# Patient Record
Sex: Male | Born: 1960 | Race: White | Hispanic: No | Marital: Single | Smoking: Current every day smoker
Health system: Southern US, Community
[De-identification: ages and names within clinical notes are randomized; demographics above are authoritative.]

## PROBLEM LIST (undated history)

## (undated) DIAGNOSIS — Z720 Tobacco use: Secondary | ICD-10-CM

## (undated) DIAGNOSIS — Z789 Other specified health status: Secondary | ICD-10-CM

## (undated) HISTORY — PX: APPENDECTOMY: SHX54

---

## 2005-10-27 ENCOUNTER — Emergency Department (HOSPITAL_COMMUNITY): Admission: EM | Admit: 2005-10-27 | Discharge: 2005-10-27 | Payer: Self-pay | Admitting: Emergency Medicine

## 2006-06-25 ENCOUNTER — Emergency Department (HOSPITAL_COMMUNITY): Admission: EM | Admit: 2006-06-25 | Discharge: 2006-06-26 | Payer: Self-pay | Admitting: Emergency Medicine

## 2006-07-08 ENCOUNTER — Ambulatory Visit: Payer: Self-pay | Admitting: *Deleted

## 2006-07-10 ENCOUNTER — Ambulatory Visit: Payer: Self-pay

## 2006-07-10 ENCOUNTER — Encounter: Payer: Self-pay | Admitting: Cardiology

## 2006-07-30 ENCOUNTER — Ambulatory Visit: Payer: Self-pay | Admitting: *Deleted

## 2006-08-14 ENCOUNTER — Ambulatory Visit (HOSPITAL_COMMUNITY): Admission: RE | Admit: 2006-08-14 | Discharge: 2006-08-14 | Payer: Self-pay | Admitting: Cardiovascular Disease

## 2007-09-04 IMAGING — CR DG CHEST 1V PORT
1 series · 1 of 1 positions shown · non-contrast
Comparison: None.

CLINICAL DATA: Heart palpitations.  
 PORTABLE CHEST - 1 VIEW:

[view not recorded]
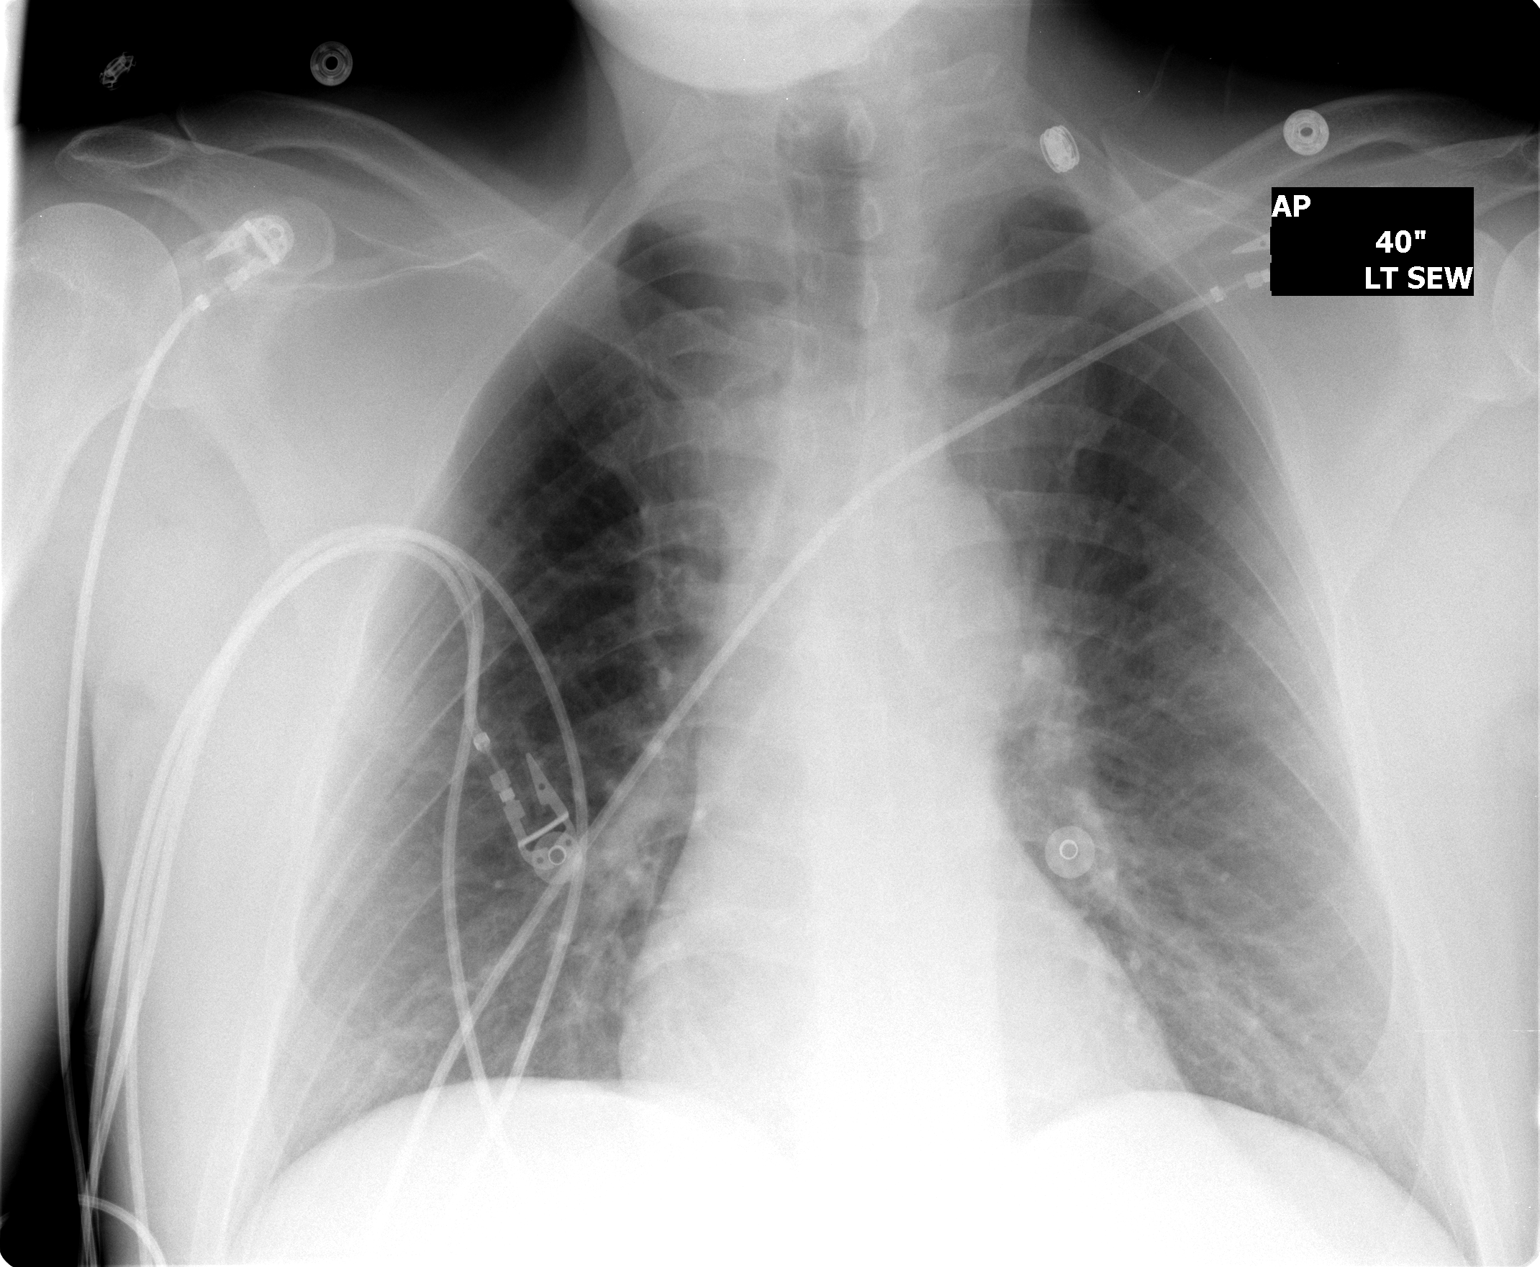

[1 of 1 positions shown; findings below may reference images not displayed]

FINDINGS: Midline trachea.  Heart size normal.   Mediastinal contours are unremarkable.  Costophrenic angles are sharp.  Peribronchial thickening.  No focal opacity.  No evidence of congestive failure.
IMPRESSION: Peribronchial thickening could be related to smoking or chronic bronchitis.  No acute disease.

## 2011-05-17 NOTE — Assessment & Plan Note (Signed)
[facility]                              CARDIOLOGY OFFICE NOTE   VINCENZO, STAVE                     MRN:          [id number]  DATE:07/30/2006                            DOB:          [birth year]    The patient is a pleasant 50 year old white married male with chest pain and  palpitations, who unfortunately continues to smoke and use excess EtOH.  He  also has a history of hyperlipidemia.   The patient states since we last saw him, he has decreased his EtOH in half,  and is down to 6 beers a day.  However, he has continued smoking.  He went  on a week vacation to the beach and felt well, but on returning to work he  noted some precordial tightness on a couple of occasions.  I should note  that his 2D echo revealed normal EF with 65%, no recent wall motion  abnormalities. This was felt to be a normal echo.  His stress test revealed  no ischemia.  He exercised for 10 minutes, had no chest pain.   He has not started his statin and aspirin, as previously suggested.   PHYSICAL EXAMINATION:  VITAL SIGNS:  Blood pressure today is 114/80, pulse  is 76.   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  However, with his  recurrent chest pain and the fact that he has multiple risk factors, I have  suggested a CT angiogram before giving him approval.  I have again discuss  the importance of discontinuing cigarettes and alcohol, and of taking his  aspirin and statin.  He will following up with Dr. Clarene Duke concerning this.  We will schedule the CT angiogram for the next week.                              Cecil Cranker, MD, [facility]    EJL/MedQ  DD:  07/30/2006  DT:  07/31/2006  Job #:  [id number]   cc:   Aida Puffer

## 2011-05-17 NOTE — Letter (Signed)
July 30, 2006     Aida Puffer, M.D.  [address]  [place], [place] [address]   RE:  [name], [name]  MRN:  [id number]  /  DOB:  1961/01/21   Dear Rosanne Ashing:   It was a pleasure to see your nice patient, Kelly Gates, for followup.  As noted, the 2-D echo and stress test were normal.  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   Thank you for the opportunity to share in this nice gentleman's care.    Sincerely,      E. Graceann Congress, MD, [facility]   EJL/MedQ  DD:  07/30/2006  DT:  07/31/2006  Job #:  [id number]

## 2011-05-17 NOTE — Assessment & Plan Note (Signed)
[facility]                              CARDIOLOGY OFFICE NOTE   Kelly Gates, Kelly Gates                     MRN:          [id number]  DATE:08/18/2006                            DOB:          [birth year]    This is a 50 year old white married male with chest pain and palpitations,  unfortunately continues to smoke and use excess ethanol.  He has history of  hyperlipidemia.  He has a recurrent pain.  He underwent a CT angiogram which  revealed normal study revealing no evidence of obstructive coronary disease,  no anomalous anatomy identified and normal LV function.  He also had a  history of tachy palpitations which improved with some reduction in ethanol.  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  Will refer  him back to Dr. Aida Puffer for follow-up.  Also apparently has a history  of hyperlipidemia.  He apparently has been given aspirin or Statin but has  not been taking it recently.                              Cecil Cranker, MD, [facility]    EJL/MedQ  DD:  08/18/2006  DT:  08/18/2006  Job #:  [id number]   cc:   Aida Puffer

## 2011-05-17 NOTE — Assessment & Plan Note (Signed)
[facility]                              CARDIOLOGY OFFICE NOTE   HENDRICKS, SCHWANDT                     MRN:          [id number]  DATE:07/30/2006                            DOB:          06/26/1961    This is a letter to Dr. Lesly Rubenstein concerning   CANCELED DICTATION                              E. Graceann Congress, MD, [facility]    EJL/MedQ  DD:  07/30/2006  DT:  07/31/2006  Job #:  [id number]

## 2011-05-17 NOTE — Assessment & Plan Note (Signed)
[facility]                              CARDIOLOGY OFFICE NOTE   Kelly, Gates                     MRN:          [id number]  DATE:                                      DOB:          1961-06-02    REFERRING PHYSICIAN:  Alric Quan    It was pleasure to see your nice patient Kelly Gates for cardiac  evaluation on July 08, 2006.  The patient states he has had recurrent  palpitations for several years, but these have increased recently.  He has  been to the emergency room on several occasions, most recently on June 25, 2006.   He appeared to be stable at that time, and he was referred here for further  evaluation.   Apparently, he did have a cardiac evaluation several years ago.   The renal profile at the hospital was normal.  The enzymes were negative.   The patient has had occasional chest pressure when he has the palpitations.  These last 30-60 minutes.   He has had no cerebral symptoms.   The patient is allergic to PENICILLIN.   He has a history of excess ethanol drinking, 6-12 beers a day, and he smokes  a pack of cigarettes a day.  He uses no other drugs and is on no medical  therapy.  He has been informed of hyperlipidemia, and I believe you had  prescribed medication for him, but he is not taking it.   He is on no medications at this time.   PAST MEDICAL HISTORY:  Reveals no serious illness.  No hospitalizations or  surgery.   REVIEW OF SYSTEMS:  HEENT:  Unremarkable.  CARDIORESPIRATORY:  As above.  GI:  Negative.  GU:  Negative.  The remainder of the review of systems is  unremarkable.   SOCIAL HISTORY:  He is a Naval architect.  He enjoys fishing.  He is married  and has one child.   FAMILY HISTORY:  Reveals father is 10, healthy.  Mother is 74, with history  of heart disease, CVAs, __________.  One brother, 37, is healthy.  Otherwise, family history unremarkable.   PHYSICAL EXAMINATION:  VITAL SIGNS:   Blood pressure 130/72, pulse 75, normal  sinus rhythm.  NECK: Normal.JVP is not elevated.  Carotid pulses are palpable.  He has no  bruits.  Thyroid normal.  LUNGS:  Revealed decreased breath sounds, but no rales or rhonchi.  CARDIAC:  Normal.  ABDOMEN:  Normal.  EXTREMITIES:  Normal pulses palpable bilaterally.  NEUROLOGIC:  Unremarkable.   EKG is normal.   IMPRESSION:  1.  Tachy-palpitations, probably supraventricular.  2.  Excess ethanol.  3.  Cigarette abuse.  4.  History of hyperlipidemia.   I have strongly recommended that he discontinue cigarettes and ethanol.  He  states that he can do this on his own, but I am not certain.   I also suggested that he start the statin which you suggested for him.   In addition, we plan to obtain a stress Myoview and a 2D echocardiogram.  Following this, if normal, he should be able to return to his employment as  a Naval architect.   If the palpitations persist, then we will need to obtain an event monitor.  In addition, he is to have a TSH.   Thank you for the opportunity to share in this nice patient's care.  I will  plan to see him back in 3-4 weeks or p.r.n.                              E. Graceann Congress, MD, [facility]    EJL/MedQ  DD:  07/08/2006  DT:  07/08/2006  Job #:  [id number]   CC:  Dr. Bunnie Philips

## 2014-05-24 ENCOUNTER — Encounter (HOSPITAL_COMMUNITY)
Admission: EM | Disposition: A | Discharge status: Home / Self Care | Payer: Self-pay | Source: Home / Self Care | Attending: Internal Medicine

## 2014-05-24 ENCOUNTER — Emergency Department (HOSPITAL_COMMUNITY): Payer: Self-pay

## 2014-05-24 ENCOUNTER — Encounter (HOSPITAL_COMMUNITY): Payer: Self-pay | Admitting: Emergency Medicine

## 2014-05-24 ENCOUNTER — Observation Stay (HOSPITAL_COMMUNITY)
Admission: EM | Admit: 2014-05-24 | Discharge: 2014-05-24 | Disposition: A | Discharge status: Home / Self Care | Payer: Self-pay | Attending: Internal Medicine | Admitting: Internal Medicine

## 2014-05-24 DIAGNOSIS — IMO0002 Reserved for concepts with insufficient information to code with codable children: Secondary | ICD-10-CM | POA: Insufficient documentation

## 2014-05-24 DIAGNOSIS — I10 Essential (primary) hypertension: Secondary | ICD-10-CM | POA: Diagnosis present

## 2014-05-24 DIAGNOSIS — Z880 Allergy status to penicillin: Secondary | ICD-10-CM | POA: Insufficient documentation

## 2014-05-24 DIAGNOSIS — R079 Chest pain, unspecified: Principal | ICD-10-CM | POA: Diagnosis present

## 2014-05-24 DIAGNOSIS — E785 Hyperlipidemia, unspecified: Secondary | ICD-10-CM | POA: Diagnosis present

## 2014-05-24 DIAGNOSIS — Z7982 Long term (current) use of aspirin: Secondary | ICD-10-CM | POA: Insufficient documentation

## 2014-05-24 DIAGNOSIS — R000 Tachycardia, unspecified: Secondary | ICD-10-CM | POA: Insufficient documentation

## 2014-05-24 DIAGNOSIS — F17200 Nicotine dependence, unspecified, uncomplicated: Secondary | ICD-10-CM | POA: Insufficient documentation

## 2014-05-24 DIAGNOSIS — F411 Generalized anxiety disorder: Secondary | ICD-10-CM | POA: Insufficient documentation

## 2014-05-24 DIAGNOSIS — I200 Unstable angina: Secondary | ICD-10-CM | POA: Diagnosis present

## 2014-05-24 DIAGNOSIS — R61 Generalized hyperhidrosis: Secondary | ICD-10-CM | POA: Insufficient documentation

## 2014-05-24 DIAGNOSIS — R030 Elevated blood-pressure reading, without diagnosis of hypertension: Secondary | ICD-10-CM

## 2014-05-24 DIAGNOSIS — Z720 Tobacco use: Secondary | ICD-10-CM | POA: Diagnosis present

## 2014-05-24 DIAGNOSIS — IMO0001 Reserved for inherently not codable concepts without codable children: Secondary | ICD-10-CM | POA: Diagnosis present

## 2014-05-24 HISTORY — DX: Tobacco use: Z72.0

## 2014-05-24 HISTORY — PX: LEFT HEART CATHETERIZATION WITH CORONARY ANGIOGRAM: SHX5451

## 2014-05-24 HISTORY — DX: Other specified health status: Z78.9

## 2014-05-24 LAB — BASIC METABOLIC PANEL
BUN: 9 mg/dL (ref 6–23)
CALCIUM: 9 mg/dL (ref 8.4–10.5)
CHLORIDE: 105 meq/L (ref 96–112)
CO2: 23 meq/L (ref 19–32)
CREATININE: 0.91 mg/dL (ref 0.50–1.35)
Glucose, Bld: 93 mg/dL (ref 70–99)
Potassium: 3.8 mEq/L (ref 3.7–5.3)
SODIUM: 141 meq/L (ref 137–147)

## 2014-05-24 LAB — LIPID PANEL
Cholesterol: 213 mg/dL — ABNORMAL HIGH (ref 0–200)
HDL: 48 mg/dL (ref >39)
LDL Cholesterol: 142 mg/dL — ABNORMAL HIGH (ref 0–99)
TRIGLYCERIDES: 117 mg/dL (ref <150)
Total CHOL/HDL Ratio: 4.4 RATIO
VLDL: 23 mg/dL (ref 0–40)

## 2014-05-24 LAB — CBC
HEMATOCRIT: 44.1 % (ref 39.0–52.0)
HEMATOCRIT: 42.5 % (ref 39.0–52.0)
HEMOGLOBIN: 15 g/dL (ref 13.0–17.0)
Hemoglobin: 15.8 g/dL (ref 13.0–17.0)
MCH: 34.2 pg — AB (ref 26.0–34.0)
MCH: 33.9 pg (ref 26.0–34.0)
MCHC: 35.8 g/dL (ref 30.0–36.0)
MCHC: 35.3 g/dL (ref 30.0–36.0)
MCV: 95.5 fL (ref 78.0–100.0)
MCV: 96.2 fL (ref 78.0–100.0)
Platelets: 214 10*3/uL (ref 150–400)
Platelets: 198 10*3/uL (ref 150–400)
RBC: 4.62 MIL/uL (ref 4.22–5.81)
RBC: 4.42 MIL/uL (ref 4.22–5.81)
RDW: 12.9 % (ref 11.5–15.5)
RDW: 12.9 % (ref 11.5–15.5)
WBC: 7.7 10*3/uL (ref 4.0–10.5)
WBC: 7.4 10*3/uL (ref 4.0–10.5)

## 2014-05-24 LAB — COMPREHENSIVE METABOLIC PANEL
ALBUMIN: 3.5 g/dL (ref 3.5–5.2)
ALT: 22 U/L (ref 0–53)
AST: 25 U/L (ref 0–37)
Alkaline Phosphatase: 130 U/L — ABNORMAL HIGH (ref 39–117)
BUN: 9 mg/dL (ref 6–23)
CO2: 24 mEq/L (ref 19–32)
CREATININE: 0.83 mg/dL (ref 0.50–1.35)
Calcium: 9.3 mg/dL (ref 8.4–10.5)
Chloride: 105 mEq/L (ref 96–112)
GFR calc Af Amer: >90 mL/min (ref >90)
GFR calc non Af Amer: >90 mL/min (ref >90)
Glucose, Bld: 85 mg/dL (ref 70–99)
Potassium: 4 mEq/L (ref 3.7–5.3)
Sodium: 142 mEq/L (ref 137–147)
TOTAL PROTEIN: 6.6 g/dL (ref 6.0–8.3)
Total Bilirubin: 0.6 mg/dL (ref 0.3–1.2)

## 2014-05-24 LAB — I-STAT TROPONIN, ED: TROPONIN I, POC: 0 ng/mL (ref 0.00–0.08)

## 2014-05-24 LAB — RAPID URINE DRUG SCREEN, HOSP PERFORMED
Amphetamines: NOT DETECTED
BENZODIAZEPINES: NOT DETECTED
Barbiturates: NOT DETECTED
Cocaine: NOT DETECTED
Opiates: NOT DETECTED
Tetrahydrocannabinol: NOT DETECTED

## 2014-05-24 LAB — LIPASE, BLOOD: LIPASE: 27 U/L (ref 11–59)

## 2014-05-24 LAB — TROPONIN I: Troponin I: <0.3 ng/mL (ref <0.30)

## 2014-05-24 SURGERY — LEFT HEART CATHETERIZATION WITH CORONARY ANGIOGRAM
Anesthesia: LOCAL

## 2014-05-24 MED ORDER — SODIUM CHLORIDE 0.9 % IJ SOLN
3.0000 mL | Freq: Two times a day (BID) | INTRAMUSCULAR | Status: DC
  Administered 2014-05-24: 3 mL via INTRAVENOUS

## 2014-05-24 MED ORDER — MORPHINE SULFATE 2 MG/ML IJ SOLN: 2.0000 mg | INTRAMUSCULAR | Status: DC | PRN

## 2014-05-24 MED ORDER — HYDRALAZINE HCL 20 MG/ML IJ SOLN: 10.0000 mg | INTRAMUSCULAR | Status: DC | PRN

## 2014-05-24 MED ORDER — ASPIRIN 81 MG PO CHEW: 81.0000 mg | CHEWABLE_TABLET | ORAL | Status: DC

## 2014-05-24 MED ORDER — ASPIRIN 81 MG PO CHEW: 81.0000 mg | CHEWABLE_TABLET | Freq: Every day | ORAL | Status: DC

## 2014-05-24 MED ORDER — ASPIRIN 81 MG PO CHEW
324.0000 mg | CHEWABLE_TABLET | Freq: Once | ORAL | Status: AC
  Administered 2014-05-24: 324 mg via ORAL
  Filled 2014-05-24: qty 4

## 2014-05-24 MED ORDER — SODIUM CHLORIDE 0.9 % IV BOLUS (SEPSIS): 1000.0000 mL | Freq: Once | INTRAVENOUS | Status: DC

## 2014-05-24 MED ORDER — PNEUMOCOCCAL VAC POLYVALENT 25 MCG/0.5ML IJ INJ: 0.5000 mL | INJECTION | INTRAMUSCULAR | Status: DC

## 2014-05-24 MED ORDER — IOHEXOL 350 MG/ML SOLN
80.0000 mL | Freq: Once | INTRAVENOUS | Status: AC | PRN
  Administered 2014-05-24: 80 mL via INTRAVENOUS

## 2014-05-24 MED ORDER — NITROGLYCERIN 0.4 MG SL SUBL: 0.4000 mg | SUBLINGUAL_TABLET | SUBLINGUAL | Status: DC | PRN

## 2014-05-24 MED ORDER — SODIUM CHLORIDE 0.9 % IJ SOLN: 3.0000 mL | Freq: Two times a day (BID) | INTRAMUSCULAR | Status: DC

## 2014-05-24 MED ORDER — SODIUM CHLORIDE 0.9 % IV SOLN: INTRAVENOUS | Status: DC

## 2014-05-24 MED ORDER — LORAZEPAM 1 MG PO TABS
0.5000 mg | ORAL_TABLET | Freq: Once | ORAL | Status: AC
  Administered 2014-05-24: 0.5 mg via ORAL
  Filled 2014-05-24: qty 1

## 2014-05-24 MED ORDER — VERAPAMIL HCL 2.5 MG/ML IV SOLN
INTRAVENOUS | Status: AC
  Filled 2014-05-24: qty 2

## 2014-05-24 MED ORDER — NITROGLYCERIN 0.2 MG/ML ON CALL CATH LAB
INTRAVENOUS | Status: AC
  Filled 2014-05-24: qty 1

## 2014-05-24 MED ORDER — ASPIRIN EC 325 MG PO TBEC
325.0000 mg | DELAYED_RELEASE_TABLET | Freq: Every day | ORAL | Status: DC
Start: 2014-05-24 — End: 2014-05-24

## 2014-05-24 MED ORDER — ENOXAPARIN SODIUM 40 MG/0.4ML ~~LOC~~ SOLN
40.0000 mg | SUBCUTANEOUS | Status: DC
  Filled 2014-05-24 (×2): qty 0.4

## 2014-05-24 MED ORDER — SODIUM CHLORIDE 0.9 % IV SOLN
INTRAVENOUS | Status: DC
  Filled 2014-05-24 (×2): qty 1000

## 2014-05-24 MED ORDER — LIDOCAINE HCL (PF) 1 % IJ SOLN
INTRAMUSCULAR | Status: AC
  Filled 2014-05-24: qty 30

## 2014-05-24 MED ORDER — SODIUM CHLORIDE 0.9 % IV SOLN: 250.0000 mL | INTRAVENOUS | Status: DC | PRN

## 2014-05-24 MED ORDER — ASPIRIN 325 MG PO TABS: 325.0000 mg | ORAL_TABLET | ORAL | Status: DC

## 2014-05-24 MED ORDER — PNEUMOCOCCAL VAC POLYVALENT 25 MCG/0.5ML IJ INJ
0.5000 mL | INJECTION | INTRAMUSCULAR | Status: DC
  Filled 2014-05-24: qty 0.5

## 2014-05-24 MED ORDER — SODIUM CHLORIDE 0.9 % IV SOLN
1.0000 mL/kg/h | INTRAVENOUS | Status: AC
  Administered 2014-05-24: 1 mL/kg/h via INTRAVENOUS

## 2014-05-24 MED ORDER — SIMVASTATIN 20 MG PO TABS: 20.0000 mg | ORAL_TABLET | Freq: Every day | ORAL | Status: DC

## 2014-05-24 MED ORDER — LISINOPRIL 10 MG PO TABS: 10.0000 mg | ORAL_TABLET | Freq: Every day | ORAL | Status: DC

## 2014-05-24 MED ORDER — SODIUM CHLORIDE 0.9 % IJ SOLN: 3.0000 mL | INTRAMUSCULAR | Status: DC | PRN

## 2014-05-24 MED ORDER — METOPROLOL TARTRATE 12.5 MG HALF TABLET: 12.5000 mg | ORAL_TABLET | Freq: Two times a day (BID) | ORAL | Status: DC

## 2014-05-24 MED ORDER — METOPROLOL TARTRATE 12.5 MG HALF TABLET
12.5000 mg | ORAL_TABLET | Freq: Two times a day (BID) | ORAL | Status: DC
  Administered 2014-05-24: 12.5 mg via ORAL
  Filled 2014-05-24 (×2): qty 1

## 2014-05-24 MED ORDER — SODIUM CHLORIDE 0.9 % IV BOLUS (SEPSIS)
2000.0000 mL | Freq: Once | INTRAVENOUS | Status: AC
  Administered 2014-05-24: 2000 mL via INTRAVENOUS

## 2014-05-24 MED ORDER — HEPARIN SODIUM (PORCINE) 1000 UNIT/ML IJ SOLN
INTRAMUSCULAR | Status: AC
  Filled 2014-05-24: qty 1

## 2014-05-24 MED ORDER — HEPARIN (PORCINE) IN NACL 2-0.9 UNIT/ML-% IJ SOLN
INTRAMUSCULAR | Status: AC
  Filled 2014-05-24: qty 1500

## 2014-05-24 MED ORDER — MIDAZOLAM HCL 2 MG/2ML IJ SOLN
INTRAMUSCULAR | Status: AC
  Filled 2014-05-24: qty 2

## 2014-05-24 MED ORDER — LISINOPRIL 10 MG PO TABS
10.0000 mg | ORAL_TABLET | Freq: Every day | ORAL | Status: DC
Start: 2014-05-24 — End: 2014-05-24
  Administered 2014-05-24: 10 mg via ORAL
  Filled 2014-05-24: qty 1

## 2014-05-24 MED ORDER — SIMVASTATIN 20 MG PO TABS
20.0000 mg | ORAL_TABLET | Freq: Every day | ORAL | Status: DC
  Filled 2014-05-24: qty 1

## 2014-05-24 NOTE — ED Provider Notes (Signed)
  Physical Exam  BP 160/84  Pulse 87  Temp(Src) 98.5 F (36.9 C) (Oral)  Resp 21  Ht 6\' 4"  (1.93 m)  Wt 200 lb (90.719 kg)  BMI 24.35 kg/m2  SpO2 96%  Physical Exam  ED Course  Procedures  MDM The patient has been reexamined at 5:30 AM, he has been chest pain-free ever since getting Ativan, chest x-ray and CT scan of the chest show no signs of acute infiltrate, pneumothorax or pulmonary embolism. His troponin is normal and his EKG   did not have ST elevations. I have discussed with the patient at length the indications for admission to the hospital. I believe that given his chest pain, diaphoresis and initial EKG that this would be an appropriate intervention however the patient has refused and states that he wants to followup as an outpatient only. I discussed this with him and his significant other in the room for some time, he again is adamant about following up as an outpatient and not wanting to be admitted to the hospital.   after discussion with his significant other, the patient changed his mind and states that he is willing to be admitted to the hospital.      [name], MD 05/24/14 [phone]

## 2014-05-24 NOTE — CV Procedure (Signed)
CARDIAC CATHETERIZATION REPORT  NAME:  Kelly Gates   MRN: [id number] DOB:  08-08-1961   ADMIT DATE: 05/24/2014 Procedure Date: 05/24/2014  INTERVENTIONAL CARDIOLOGIST: Leonie Man, M.D., MS PRIMARY CARE PROVIDER: No PCP Per Patient PRIMARY CARDIOLOGIST: Mertie Moores, M.D.  PATIENT:  Kelly Gates is a 53 y.o. male no significant past medical history besides hypertension and chronic smoking who was admitted last night (05/23/2014) with significant episode of chest pressure and pain that woke him up from sleep. So she would diaphoresis and dyspnea. Upon evaluation emergency room while having chest pain he did have some ST depressions that resolved after treatment with nitroglycerin. He is ruled out for MI, but with severe anginal symptoms and significant T-wave changes, when seen by Dr. Acie Fredrickson in consultation, he felt the best to proceed with invasive evaluation. He has had a CT scan of his chest to rule out PE.  PRE-OPERATIVE DIAGNOSIS:    Unstable Angina / ACS  PROCEDURES PERFORMED:    Left Heart Catheterization Coronary Angiography  PROCEDURE:Consent:  Risks of procedure as well as the alternatives and risks of each were explained to the (patient/caregiver).  Consent for procedure obtained. Consent for signed by MD and patient with RN witness -- placed on chart.   PROCEDURE: The patient was brought to the 2nd [place] [facility] Catheter[facility] Lab in the fasting state and prepped and draped in the usual sterile fashion for Right groin or radial access. A modified Allen's test with plethysmography was performed, revealing excellent Ulnar artery collateral flow.  Sterile technique was used including antiseptics, cap, gloves, gown, hand hygiene, mask and sheet.  Skin prep: Chlorhexidine.  Time Out: Verified patient identification, verified procedure, site/side was marked, verified correct patient position, special equipment/implants available,  medications/allergies/relevent history reviewed, required imaging and test results available.  Performed  Access: Right Radial Artery; 6 Fr Sheath -- Seldinger technique (Angiocath Micropuncture Kit)  IA Radial Cocktail - 10 mL, IV Heparin 5000 Units  Diagnostic Left Heart Catheterization with Coronary Angiography:  5 Fr TIG 4.0,  JR 4, & Angled Pigtail catheters advanced and exchanged over Long Exchange Safety J-Wire into the ascending aorta and used for selective coronary artery engagement.  Left Coronary Artery Angiography: TIG 4.0  Right Coronary Artery Angiography: JR 4  LV Hemodynamics (LV Gram): Angled pigtail  TR Band:  1020 Hours, 15 mL air  Hemodynamics:  Central Aortic / Mean Pressures: 120/90/100 mmHg  Left Ventricular Pressures / EDP: 124/80/15 mmHg  Left Ventriculography:  EF: 65-70 %  Wall Motion: Hyperdynamic  Coronary Anatomy: Codominant  Left Main: Very short large-caliber vessel. Bifurcates into the LAD and Circumflex. Angiographically normal. LAD: Large-caliber vessel that reaches down around the apex. There 3 diagonal branches and one major septal perforator. Angiographically normal.  D1: Small-caliber proximal vessel, angiographically normal.  D2: Small to moderate-caliber early mid vessel; angiographically normal  D3: Small to moderate caliber vessel from the mid LAD, angiographically normal.  Left Circumflex: Large caliber, codominant vessel that gives off OM1 before coursing into the AV groove. Once in the AV groove it gives off a second bifurcating marginal branch before coursing distally into a posterior lateral branch. The second marginal gives off OM 2 and LPL 1. Angiographically normal.  OM1: Large-caliber vessel that tapers into a small vessel distally. Mildly tortuous, angiographically normal   OM 2: Very large caliber vessel gives off 2 OM 2 and LPL 1. OM 2 continues as a large caliber vessel it is tortuous in course along the  inferolateral  border. LPL 1 the inferior branch smaller moderate large-caliber branch that tapers distally along the posterolateral wall.   RCA: Large-caliber codominant vessel the terminates as a short posterior descending artery. Angiographically normal.   After reviewing the initial angiography, no culprit lesion was identified.    MEDICATIONS:  Anesthesia:  Local Lidocaine 2 ml  Sedation:  2 mg IV Versed, 25 mcg IV fentanyl ;   Omnipaque Contrast: 80 ml  Anticoagulation:  IV Heparin 4500 Units  Radial Cocktail: 5 mg Verapamil, 400 mcg NTG, 2 ml 2% Lidocaine in 10 ml NS   PATIENT DISPOSITION:    The patient was transferred to the PACU holding area in a hemodynamicaly stable, chest pain free condition.  The patient tolerated the procedure well, and there were no complications.  EBL:   < 5 ml  The patient was stable before, during, and after the procedure.  POST-OPERATIVE DIAGNOSIS:    Angiographically normal coronary arteries with no culprit lesion for anginal chest pain. Consider coronary spasm versus noncardiac chest pain  Hyperdynamic left ventricle with mildly elevated LVEDP  PLAN OF CARE:  Standard post radial cath care.   Would consider treatment for possible coronary vasospasm.  Aggressive risk modification including statin, antihypertensives, and smoking cessation counseling   Leonie Man, M.D., M.S. [facility] [address] [place], [place]  [address]  [phone]  05/24/2014 9:43 AM

## 2014-05-24 NOTE — ED Notes (Signed)
CT notified pt available for transport.  ETA <30 min.

## 2014-05-24 NOTE — Progress Notes (Signed)
Patient being discharged. Patient to follow up with a new PCP. Pt desires to receive the pneumonia vaccination at that time.

## 2014-05-24 NOTE — H&P (View-Only) (Signed)
CONSULT NOTE  Date: 05/24/2014               Patient Name:  Kelly Gates MRN: [id number]  DOB: 1961/02/13 Age / Sex: 53 y.o., male        PCP: No PCP Per Patient Primary Cardiologist: New/Eular Panek            Referring Physician: Toniann Fail              Reason for Consult: Chest pain            History of Present Illness: Patient is a 53 y.o. male with no significant past medical history, who was admitted to [facility] on 05/24/2014 for evaluation of chest discomfort.   The chest pain / pressure woke him from sleep.    The pain was described as a chest pressure it was associated with diaphoresis.    He has a hx of CP in the past - has had a negative coronary CT angio in 2007.  He thinks he may have had a stress test at our office  ( results not found).  He was having chest pressure this morning on admission. His EKG showed mild ST segment depression. After treatment he became pain-free and his ST segment depression resolved. He's currently pain-free this morning. His troponin levels are negative.  A CT angiogram of the chest which was negative for pulmonary embolus.   Medications: Outpatient medications: No prescriptions prior to admission    Current medications: Current Facility-Administered Medications  Medication Dose Route Frequency Provider Last Rate Last Dose  . aspirin EC tablet 325 mg  325 mg Oral Daily Eduard Clos, MD      . enoxaparin (LOVENOX) injection 40 mg  40 mg Subcutaneous Q24H Eduard Clos, MD      . hydrALAZINE (APRESOLINE) injection 10 mg  10 mg Intravenous Q4H PRN Eduard Clos, MD      . morphine 2 MG/ML injection 2 mg  2 mg Intravenous Q2H PRN Eduard Clos, MD      . nitroGLYCERIN (NITROSTAT) SL tablet 0.4 mg  0.4 mg Sublingual Q5 min PRN Eduard Clos, MD         Allergies  Allergen Reactions  . Penicillins Other (See Comments)    unknown     Past Medical History  Diagnosis Date  . Medical history  non-contributory     Past Surgical History  Procedure Laterality Date  . Appendectomy      Family History  Problem Relation Age of Onset  . Stroke Mother     Social History:  reports that he has been smoking.  He does not have any smokeless tobacco history on file. He reports that he drinks alcohol. He reports that he does not use illicit drugs.   Review of Systems: Constitutional:  denies fever, chills, diaphoresis, appetite change and fatigue.  HEENT: denies photophobia, eye pain, redness, hearing loss, ear pain, congestion, sore throat, rhinorrhea, sneezing, neck pain, neck stiffness and tinnitus.  Respiratory: denies SOB, DOE, cough, chest tightness, and wheezing.  Cardiovascular: admits to chest pain.  He denies  palpitations and leg swelling.  Gastrointestinal: denies nausea, vomiting, abdominal pain, diarrhea, constipation, blood in stool.  Genitourinary: denies dysuria, urgency, frequency, hematuria, flank pain and difficulty urinating.  Musculoskeletal: denies  myalgias, back pain, joint swelling, arthralgias and gait problem.   Skin: denies pallor, rash and wound.  Neurological: denies dizziness, seizures, syncope, weakness, light-headedness, numbness and headaches.  Hematological: denies adenopathy, easy bruising, personal or family bleeding history.  Psychiatric/ Behavioral: denies suicidal ideation, mood changes, confusion, nervousness, sleep disturbance and agitation.    Physical Exam: BP 150/99  Pulse 91  Temp(Src) 98 F (36.7 C) (Oral)  Resp 16  Ht 6\' 4"  (1.93 m)  Wt 200 lb (90.719 kg)  BMI 24.35 kg/m2  SpO2 94%  Wt Readings from Last 3 Encounters:  05/24/14 200 lb (90.719 kg)    General: Vital signs reviewed and noted. Well-developed, well-nourished, in no acute distress; alert,   Head: Normocephalic, atraumatic, sclera anicteric,   Neck: Supple. Negative for carotid bruits. No JVD   Lungs:  Clear bilaterally, no  wheezes, rales, or rhonchi.  Breathing is normal   Heart: RRR with S1 S2. No murmurs, rubs, or gallops   Abdomen:  Soft, non-tender, non-distended with normoactive bowel sounds. No hepatomegaly. No rebound/guarding. No obvious abdominal masses   MSK: Strength and the appear normal for age.   Extremities: No clubbing or cyanosis. No edema.  Distal pedal pulses are 2+ and equal   Neurologic: Alert and oriented X 3. Moves all extremities spontaneously.  Psych: Responds to questions appropriately with a normal affect.     Lab results: Basic Metabolic Panel:  Recent Labs Lab 05/24/14 0334  NA 141  K 3.8  CL 105  CO2 23  GLUCOSE 93  BUN 9  CREATININE 0.91  CALCIUM 9.0    Liver Function Tests: No results found for this basename: AST, ALT, ALKPHOS, BILITOT, PROT, ALBUMIN,  in the last 168 hours No results found for this basename: LIPASE, AMYLASE,  in the last 168 hours No results found for this basename: AMMONIA,  in the last 168 hours  CBC:  Recent Labs Lab 05/24/14 0334  WBC 7.7  HGB 15.8  HCT 44.1  MCV 95.5  PLT 214    Cardiac Enzymes: No results found for this basename: CKTOTAL, CKMB, CKMBINDEX, TROPONINI,  in the last 168 hours  BNP: No components found with this basename: POCBNP,   CBG: No results found for this basename: GLUCAP,  in the last 168 hours  Coagulation Studies: No results found for this basename: LABPROT, INR,  in the last 72 hours   Other results: EKG:   NSR, mild ST depression in the inferior leads.   Imaging: Ct Angio Chest Pe W/cm &/or Wo Cm  05/24/2014   CLINICAL DATA:  Chest pain, shortness of breath.  EXAM: CT ANGIOGRAPHY CHEST WITH CONTRAST  TECHNIQUE: Multidetector CT imaging of the chest was performed using the standard protocol during bolus administration of intravenous contrast. Multiplanar CT image reconstructions and MIPs were obtained to evaluate the vascular anatomy.  CONTRAST:  80mL OMNIPAQUE IOHEXOL 350 MG/ML SOLN  COMPARISON:  DG CHEST 1V PORT dated  05/24/2014  FINDINGS: Adequate contrast opacification of the pulmonary artery's. Main pulmonary artery is not enlarged. No pulmonary arterial filling defects to the level of the subsegmental branches.  Heart and pericardium are unremarkable, no right heart strain. Thoracic aorta is normal course and caliber, mild calcific atherosclerosis of the aortic arch. No lymphadenopathy by CT size criteria. Tracheobronchial tree is patent, no pneumothorax. Mild apical paraseptal emphysema. No pleural effusions or focal consolidations.  Included view of the abdomen is unremarkable. Visualized soft tissues and included osseous structures appear normal.  Review of the MIP images confirms the above findings.  IMPRESSION: No acute pulmonary embolism.  Mild biapical paraseptal emphysema without acute cardiopulmonary process.   Electronically Signed   By:  Courtnay  Bloomer   On: 05/24/2014 05:18   Dg Chest Port 1 View  05/24/2014   CLINICAL DATA:  Shortness of breath.  EXAM: PORTABLE CHEST - 1 VIEW  COMPARISON:  Chest radiograph performed 06/26/2006  FINDINGS: The lungs are well-aerated and clear. There is no evidence of focal opacification, pleural effusion or pneumothorax. The left costophrenic angle is incompletely imaged on this study.  The cardiomediastinal silhouette is within normal limits. No acute osseous abnormalities are seen.  IMPRESSION: No acute cardiopulmonary process seen.   Electronically Signed   By: Roanna RaiderJeffery  Chang M.D.   On: 05/24/2014 03:55         Assessment & Plan: . Patient Active Problem List   Diagnosis Date Noted  . Chest pain 05/24/2014  . Elevated blood pressure 05/24/2014   1:  Chest discomfort: Christiane HaJonathan presents with episodes of chest pressure and chest pain. He had mild ST segment depression with these pains I think that he needs a cardiac catheterization. He's had a CT angiogram past which was negative. He has continued to smoke and is at risk of coronary artery disease.  We have  discussed the risks, benefits, and options concerning cardiac cath.   He understands and agrees to proceed.  2. Hypertension:  He Eats a lot of fast food. 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 We'll start him on lisinopril 10 mg a day.   Vesta MixerPhilip J. Jayen Bromwell, Montez HagemanJr., MD, [facility] 05/24/2014, 8:01 AM Office - [phone] Pager [phone]

## 2014-05-24 NOTE — Progress Notes (Addendum)
I have seen and assessed patient and agree with Dr. Katherene Ponto assessment and plan. Patient is currently chest pain-free. Patient has been assessed by cardiology and patient to undergo cardiac catheterization today. Patient with CT angiogram of the chest which was negative for PE at 5 AM this morning. Will give a bolus of normal saline 2 L x1 and then normal saline at 125 cc per hour prior to cardiac catheterization secondary to increased dye load. Monitor renal function closely. Will check a fasting lipid panel. Patient has been started on lisinopril and metoprolol per cardiology. Cardiology following and appreciate input and recommendations.

## 2014-05-24 NOTE — ED Notes (Signed)
Dr Miller at bedside. 

## 2014-05-24 NOTE — ED Notes (Signed)
Pt state CP that started an hour ago. Pt states that the pain is centered around his heart and does not radiate. Pt denies SOB or N/V.

## 2014-05-24 NOTE — Interval H&P Note (Signed)
History and Physical Interval Note:  05/24/2014 9:23 AM  Kelly Gates  has presented today for surgery, with the diagnosis of Unstable Angina / ACS.  The various methods of treatment have been discussed with the patient and family. After consideration of risks, benefits and other options for treatment, the patient has consented to  Procedure(s): LEFT HEART CATHETERIZATION WITH CORONARY ANGIOGRAM (N/A) +/- PCI as a surgical intervention .    The patient's history has been reviewed, patient examined, no change in status, stable for surgery.  I have reviewed the patient's chart and labs.  Questions were answered to the patient's satisfaction.     Kelly Gates  Cath Lab Visit (complete for each Cath Lab visit)  Clinical Evaluation Leading to the Procedure:   ACS: yes  Non-ACS:    Anginal Classification: CCS IV  Anti-ischemic medical therapy: Minimal Therapy (1 class of medications)  Non-Invasive Test Results: No non-invasive testing performed  Prior CABG: No previous CABG

## 2014-05-24 NOTE — H&P (Signed)
[facility] History and Physical  Kelly Gates UTM:[id number] DOB: 1961/12/05 DOA: 05/24/2014  Referring physician: ER physician. PCP: No PCP Per Patient   Chief Complaint: Chest pain.  HPI: Kelly Gates is a 53 y.o. male with no significant past medical history presents to the ER because of chest pain. Patient states that he woke up from sleep with chest pain at 2 AM. Pain is left-sided chest a pressure-like and was associated with diaphoresis. Denies any associated shortness of breath nausea vomiting or abdominal pain. In the ER patient was found to be tachycardic with nonspecific ST T changes in the inferior leads. CT angiogram of the chest was negative for PE. Patient pressures had been elevated and patient has history of tobacco abuse and given patient's nonspecific EKG changes in the inferior leads, tobacco abuse and elevated blood pressure patient admitted with for further workup on his chest pain. Presently patient is chest pain free.  Review of Systems: As presented in the history of presenting illness, rest negative.  Past Medical History  Diagnosis Date  . Medical history non-contributory    Past Surgical History  Procedure Laterality Date  . Appendectomy     Social History:  reports that he has been smoking.  He does not have any smokeless tobacco history on file. He reports that he drinks alcohol. He reports that he does not use illicit drugs. Where does patient live home. Can patient participate in ADLs? Yes.  Allergies  Allergen Reactions  . Penicillins Other (See Comments)    unknown    Family History:  Family History  Problem Relation Age of Onset  . Stroke Mother       Prior to Admission medications   Medication Sig Start Date End Date Taking? Authorizing Provider  aspirin 81 MG chewable tablet Chew 1 tablet (81 mg total) by mouth daily. 05/24/14   [name], MD    Physical Exam: Filed Vitals:   05/24/14 0537 05/24/14 0600 05/24/14  0615 05/24/14 0630  BP: 122/92 149/94 155/94 155/103  Pulse: 89 85 87 85  Temp:      TempSrc:      Resp: 19 19 20 19   Height:      Weight:      SpO2: 97% 97% 96% 96%     General:  Well-developed and nourished.  Eyes: Anicteric no pallor.  ENT: No discharge from the ears eyes nose mouth.  Neck: No mass felt.  Cardiovascular: S1-S2 heard.  Respiratory: No rhonchi or crepitations.  Abdomen: Soft nontender bowel sounds present. No guarding rigidity.  Skin: No rash.  Musculoskeletal: No edema.  Psychiatric: Appears normal.  Neurologic: Alert awake oriented to time place and person. Moves all extremities.  Labs on Admission:  Basic Metabolic Panel:  Recent Labs Lab 05/24/14 0334  NA 141  K 3.8  CL 105  CO2 23  GLUCOSE 93  BUN 9  CREATININE 0.91  CALCIUM 9.0   Liver Function Tests: No results found for this basename: AST, ALT, ALKPHOS, BILITOT, PROT, ALBUMIN,  in the last 168 hours No results found for this basename: LIPASE, AMYLASE,  in the last 168 hours No results found for this basename: AMMONIA,  in the last 168 hours CBC:  Recent Labs Lab 05/24/14 0334  WBC 7.7  HGB 15.8  HCT 44.1  MCV 95.5  PLT 214   Cardiac Enzymes: No results found for this basename: CKTOTAL, CKMB, CKMBINDEX, TROPONINI,  in the last 168 hours  BNP (last 3 results) No  results found for this basename: PROBNP,  in the last 8760 hours CBG: No results found for this basename: GLUCAP,  in the last 168 hours  Radiological Exams on Admission: Ct Angio Chest Pe W/cm &/or Wo Cm  05/24/2014   CLINICAL DATA:  Chest pain, shortness of breath.  EXAM: CT ANGIOGRAPHY CHEST WITH CONTRAST  TECHNIQUE: Multidetector CT imaging of the chest was performed using the standard protocol during bolus administration of intravenous contrast. Multiplanar CT image reconstructions and MIPs were obtained to evaluate the vascular anatomy.  CONTRAST:  80mL OMNIPAQUE IOHEXOL 350 MG/ML SOLN  COMPARISON:  DG  CHEST 1V PORT dated 05/24/2014  FINDINGS: Adequate contrast opacification of the pulmonary artery's. Main pulmonary artery is not enlarged. No pulmonary arterial filling defects to the level of the subsegmental branches.  Heart and pericardium are unremarkable, no right heart strain. Thoracic aorta is normal course and caliber, mild calcific atherosclerosis of the aortic arch. No lymphadenopathy by CT size criteria. Tracheobronchial tree is patent, no pneumothorax. Mild apical paraseptal emphysema. No pleural effusions or focal consolidations.  Included view of the abdomen is unremarkable. Visualized soft tissues and included osseous structures appear normal.  Review of the MIP images confirms the above findings.  IMPRESSION: No acute pulmonary embolism.  Mild biapical paraseptal emphysema without acute cardiopulmonary process.   Electronically Signed   By: Awilda Metroourtnay  Bloomer   On: 05/24/2014 05:18   Dg Chest Port 1 View  05/24/2014   CLINICAL DATA:  Shortness of breath.  EXAM: PORTABLE CHEST - 1 VIEW  COMPARISON:  Chest radiograph performed 06/26/2006  FINDINGS: The lungs are well-aerated and clear. There is no evidence of focal opacification, pleural effusion or pneumothorax. The left costophrenic angle is incompletely imaged on this study.  The cardiomediastinal silhouette is within normal limits. No acute osseous abnormalities are seen.  IMPRESSION: No acute cardiopulmonary process seen.   Electronically Signed   By: Roanna RaiderJeffery  Chang M.D.   On: 05/24/2014 03:55    EKG: Independently reviewed. Initial EKG was showing sinus tachycardia with nonspecific ST changes in the inferior leads. RSR pattern.  Assessment/Plan Principal Problem:   Chest pain Active Problems:   Elevated blood pressure   #1. Chest pain - given patient's risk factors patient will be observed. Cycle cardiac markers. 2-D echo. Aspirin. Nitroglycerin when necessary. N.p.o. in anticipation of possible cardiac procedure. #2. Elevated  blood pressure - when necessary IV hydralazine for systolic blood pressure more than 160. Closely observe blood pressure trends. #3. Tobacco abuse - advised to quit smoking. #4. CT findings of emphysema - presently not wheezing.    Code Status: Full code.  Family Communication: None.  Disposition Plan: Admit for observation.    Eduard ClosArshad N Devani Odonnel [facility] Pager [phone].  If 7PM-7AM, please contact night-coverage [email or web address] Password [email or web address] 05/24/2014, 7:11 AM

## 2014-05-24 NOTE — Discharge Summary (Signed)
Physician Discharge Summary  Kelly Gates VOH:[id number] DOB: Jul 02, 1961 DOA: 05/24/2014  PCP: No PCP Per Patient  Admit date: 05/24/2014 Discharge date: 05/24/2014  Time spent: 65 minutes  Recommendations for Outpatient Follow-up:  1. [facility] as scheduled tomorrow 05/25/2014. On followup patient says that need to be reassessed. Patient will need a comprehensive metabolic profile done to followup on electrolytes renal function and LFTs. Patient received dye load from CT angiogram as well as cardiac catheterization which were done both on the same day and his renal function needs to be monitored closely. Patient's blood pressure also need to be followed up upon.  Discharge Diagnoses:  Principal Problem:   Chest pain Active Problems:   Elevated blood pressure   Unstable angina   HTN (hypertension)   Hyperlipidemia   Tobacco abuse   Discharge Condition: Stable and improved  Diet recommendation: Heart healthy diet  Filed Weights   05/24/14 0320 05/24/14 0758  Weight: 90.719 kg (200 lb) 90.719 kg (200 lb)    History of present illness:  Kelly Gates is a 53 y.o. male with no significant past medical history presents to the ER because of chest pain. Patient states that he woke up from sleep with chest pain at 2 AM. Pain is left-sided chest a pressure-like and was associated with diaphoresis. Denies any associated shortness of breath nausea vomiting or abdominal pain. In the ER patient was found to be tachycardic with nonspecific ST T changes in the inferior leads. CT angiogram of the chest was negative for PE. Patient pressures had been elevated and patient has history of tobacco abuse and given patient's nonspecific EKG changes in the inferior leads, tobacco abuse and elevated blood pressure patient admitted with for further workup on his chest pain. Presently patient is chest pain free   Hospital Course:  #1 chest pain The  patient presented to the ED with chest pain that awoke him around 2 AM on the day of admission which was left-sided pressure-like with associated diaphoresis. Patient was noted to be tachycardic an EKG had some mild ST depression. CT angiogram of the chest which was done was negative for PE. Chest x-ray done was negative. Due to patient's multiple risk factors of tobacco abuse, elevated blood pressure he was admitted to telemetry for further evaluation. Cardiac enzymes were cycled with troponin was negative x1. Fasting lipid panel obtained and the LDL of 142. Patient was seen in consultation by cardiology by Dr. Elease Hashimoto who had recommended a cardiac catheterization. Patient subsequently underwent cardiac catheterization on 05/24/2014 which showed angiographically normal coronary arteries with no culprit lesion with ejection fraction of 65-70%. Patient was placed on lisinopril and metoprolol for elevated blood pressure and started on a statin for hyperlipidemia. Patient will followup with PCP as outpatient.  #2 hypertension/elevated blood pressure On admission patient was noted to have elevated blood pressure. Patient was started on low-dose metoprolol and lisinopril with better blood pressure control. Patient will followup with PCP as outpatient on followup will need a comprehensive metabolic profile done to followup on electrolytes renal function.  #3 hyperlipidemia Fasting lipid panel which was obtained during the hospitalization to a total cholesterol of 213. LDL of 142. Patient was started on a statin. Patient will followup with PCP as outpatient.  Procedures:  Cardiac catheterization 05/24/2014  angiographically normal coronary arteries with no culprit lesion for anginal chest pain. Hyperdynamic left ventricle with mildly elevated left ventricular end-diastolic pressure. Ejection fraction = 65-70%. Dr. Herbie Baltimore  CT  chest 05/24/2014  Chest x-ray 05/24/2014  Consultations:  Cardiology: Dr. Elease Hashimoto  05/24/2014  Discharge Exam: Filed Vitals:   05/24/14 1429  BP: 132/87  Pulse: 74  Temp: 97.6 F (36.4 C)  Resp: 16    General: NAD Cardiovascular: RRR Respiratory: CTAB  Discharge Instructions You were cared for by a hospitalist during your hospital stay. If you have any questions about your discharge medications or the care you received while you were in the hospital after you are discharged, you can call the unit and asked to speak with the hospitalist on call if the hospitalist that took care of you is not available. Once you are discharged, your primary care physician will handle any further medical issues. Please note that NO REFILLS for any discharge medications will be authorized once you are discharged, as it is imperative that you return to your primary care physician (or establish a relationship with a primary care physician if you do not have one) for your aftercare needs so that they can reassess your need for medications and monitor your lab values.      Discharge Instructions   Diet - low sodium heart healthy    Complete by:  As directed      Discharge instructions    Complete by:  As directed   [facility] tomorrow for labs/PCP     Increase activity slowly    Complete by:  As directed             Medication List         aspirin 81 MG chewable tablet  Chew 1 tablet (81 mg total) by mouth daily.     lisinopril 10 MG tablet  Commonly known as:  PRINIVIL,ZESTRIL  Take 1 tablet (10 mg total) by mouth daily.     metoprolol tartrate 12.5 mg Tabs tablet  Commonly known as:  LOPRESSOR  Take 0.5 tablets (12.5 mg total) by mouth 2 (two) times daily.     simvastatin 20 MG tablet  Commonly known as:  ZOCOR  Take 1 tablet (20 mg total) by mouth daily at 6 PM.       Allergies  Allergen Reactions  . Penicillins Other (See Comments)    unknown   Follow-up Information   Follow up with No PCP Per Patient. (see the follow up list for  family doctor follow up - call today to arrange this)    Specialty:  General Practice      Follow up with [facility] Main [facility]). Call today. (to arrange follow up for a stress test this week)    Specialty:  Cardiology   Contact information:   [address] [place] [address] [phone]      Follow up with [facility] EMERGENCY DEPARTMENT. (If symptoms worsen such as chest pain or difficulty breathing)    Specialty:  Emergency Medicine   Contact information:   [address] [id number] [place] [address] [phone]      Follow up with [facility]     On 05/25/2014. (11:30 Dr Towanda Octave Care Provider and CMET. [facility] Assistance-if not able to make appointment please call [phone] as soon as possible. )    Contact information:   [address] [place] [address] [phone]       The results of significant diagnostics from this hospitalization (including imaging, microbiology, ancillary and laboratory) are listed below for reference.  Significant Diagnostic Studies: Ct Angio Chest Pe W/cm &/or Wo Cm  05/24/2014   CLINICAL DATA:  Chest pain, shortness of breath.  EXAM: CT ANGIOGRAPHY CHEST WITH CONTRAST  TECHNIQUE: Multidetector CT imaging of the chest was performed using the standard protocol during bolus administration of intravenous contrast. Multiplanar CT image reconstructions and MIPs were obtained to evaluate the vascular anatomy.  CONTRAST:  80mL OMNIPAQUE IOHEXOL 350 MG/ML SOLN  COMPARISON:  DG CHEST 1V PORT dated 05/24/2014  FINDINGS: Adequate contrast opacification of the pulmonary artery's. Main pulmonary artery is not enlarged. No pulmonary arterial filling defects to the level of the subsegmental branches.  Heart and pericardium are unremarkable, no right heart strain. Thoracic aorta is normal course and caliber, mild calcific atherosclerosis of the  aortic arch. No lymphadenopathy by CT size criteria. Tracheobronchial tree is patent, no pneumothorax. Mild apical paraseptal emphysema. No pleural effusions or focal consolidations.  Included view of the abdomen is unremarkable. Visualized soft tissues and included osseous structures appear normal.  Review of the MIP images confirms the above findings.  IMPRESSION: No acute pulmonary embolism.  Mild biapical paraseptal emphysema without acute cardiopulmonary process.   Electronically Signed   By: Awilda Metroourtnay  Bloomer   On: 05/24/2014 05:18   Dg Chest Port 1 View  05/24/2014   CLINICAL DATA:  Shortness of breath.  EXAM: PORTABLE CHEST - 1 VIEW  COMPARISON:  Chest radiograph performed 06/26/2006  FINDINGS: The lungs are well-aerated and clear. There is no evidence of focal opacification, pleural effusion or pneumothorax. The left costophrenic angle is incompletely imaged on this study.  The cardiomediastinal silhouette is within normal limits. No acute osseous abnormalities are seen.  IMPRESSION: No acute cardiopulmonary process seen.   Electronically Signed   By: Roanna RaiderJeffery  Chang M.D.   On: 05/24/2014 03:55    Microbiology: No results found for this or any previous visit (from the past 240 hour(s)).   Labs: Basic Metabolic Panel:  Recent Labs Lab 05/24/14 0334 05/24/14 0840  NA 141 142  K 3.8 4.0  CL 105 105  CO2 23 24  GLUCOSE 93 85  BUN 9 9  CREATININE 0.91 0.83  CALCIUM 9.0 9.3   Liver Function Tests:  Recent Labs Lab 05/24/14 0840  AST 25  ALT 22  ALKPHOS 130*  BILITOT 0.6  PROT 6.6  ALBUMIN 3.5    Recent Labs Lab 05/24/14 0840  LIPASE 27   No results found for this basename: AMMONIA,  in the last 168 hours CBC:  Recent Labs Lab 05/24/14 0334 05/24/14 0840  WBC 7.7 7.4  HGB 15.8 15.0  HCT 44.1 42.5  MCV 95.5 96.2  PLT 214 198   Cardiac Enzymes:  Recent Labs Lab 05/24/14 0840  TROPONINI <0.30   BNP: BNP (last 3 results) No results found for this  basename: PROBNP,  in the last 8760 hours CBG: No results found for this basename: GLUCAP,  in the last 168 hours     Signed:  Rodolph Bonganiel Fredericka Bottcher V MD  [facility] 05/24/2014, 4:58 PM

## 2014-05-24 NOTE — ED Provider Notes (Signed)
CSN: [id number]     Arrival date & time 05/24/14  0315 History   First MD Initiated Contact with Patient 05/24/14 [phone]     Chief Complaint  Patient presents with  . Chest Pain     (Consider location/radiation/quality/duration/timing/severity/associated sxs/prior Treatment) HPI Comments: 53 year old male with no past medical history who smokes cigarettes everyday and is a long-distance truck driver presents with a complaint of chest pain described as left-sided, feeling like his "heart is swollen" and intermittent sharp and stabbing left-sided pains. This started one hour and 15 minutes prior to arrival, I will give from sleep and is associated with diaphoresis but no coughing, fevers, swelling of the legs. The symptoms are persistent, intermittent severe pains. He has never had any exertional symptoms but states that he does not exercise at all. He has never had any known heart problems and never had a blood clot.  Patient is a 53 y.o. male presenting with chest pain. The history is provided by the patient and a relative.  Chest Pain   History reviewed. No pertinent past medical history. History reviewed. No pertinent past surgical history. History reviewed. No pertinent family history. History  Substance Use Topics  . Smoking status: Current Every Day Smoker  . Smokeless tobacco: Not on file  . Alcohol Use: Yes    Review of Systems  Cardiovascular: Positive for chest pain.  All other systems reviewed and are negative.     Allergies  Penicillins  Home Medications   Prior to Admission medications   Not on File   BP 166/84  Pulse 97  Temp(Src) 98.5 F (36.9 C) (Oral)  Resp 24  Ht 6\' 4"  (1.93 m)  Wt 200 lb (90.719 kg)  BMI 24.35 kg/m2  SpO2 97% Physical Exam  Nursing note and vitals reviewed. Constitutional: He appears well-developed and well-nourished. No distress.  HENT:  Head: Normocephalic and atraumatic.  Mouth/Throat: Oropharynx is clear and moist. No  oropharyngeal exudate.  Eyes: Conjunctivae and EOM are normal. Pupils are equal, round, and reactive to light. Right eye exhibits no discharge. Left eye exhibits no discharge. No scleral icterus.  Neck: Normal range of motion. Neck supple. No JVD present. No thyromegaly present.  Cardiovascular: Regular rhythm, normal heart sounds and intact distal pulses.  Exam reveals no gallop and no friction rub.   No murmur heard. Tachycardic to 105, strong pulses at the radial arteries, no JVD  Pulmonary/Chest: Effort normal and breath sounds normal. No respiratory distress. He has no wheezes. He has no rales.  Abdominal: Soft. Bowel sounds are normal. He exhibits no distension and no mass. There is no tenderness.  Musculoskeletal: Normal range of motion. He exhibits no edema and no tenderness.  Lymphadenopathy:    He has no cervical adenopathy.  Neurological: He is alert. Coordination normal.  Skin: Skin is warm. No rash noted. He is diaphoretic. No erythema.  Psychiatric:  The patient appears anxious and mildly agitated, answers questions appropriately, no depression    ED Course  Procedures (including critical care time) Labs Review Labs Reviewed  CBC  BASIC METABOLIC PANEL  I-STAT TROPOININ, ED    Imaging Review No results found.   EKG Interpretation   Date/Time:  Tuesday May 24 2014 03:18:02 EDT Ventricular Rate:  101 PR Interval:  120 QRS Duration: 78 QT Interval:  362 QTC Calculation: 469 R Axis:   72 Text Interpretation:  Sinus tachycardia ST depression in the inferior  leads, Abnormal ekg Since last tracing st abnormalities now present  Confirmed by Kristell Wooding  MD, Daking Westervelt ([id number]) on 05/24/2014 3:45:43 AM      MDM   Final diagnoses:  None    The patient has new onset of chest pain with a mild tachycardia. His concern is his heart, I would also consider pulmonary embolism as a possible etiology of the patient's symptoms. Unfortunately he is high enough risk that a d-dimer  would not be sufficient thus a CT scan of the chest will be ordered. Initial EKG does not show any ischemia.  Meds given in ED:  Medications  LORazepam (ATIVAN) tablet 0.5 mg (not administered)  aspirin chewable tablet 324 mg (324 mg Oral Given 05/24/14 0342)    New Prescriptions   No medications on file        Vida RollerBrian D Jaielle Dlouhy, MD 05/24/14 [phone]

## 2014-05-24 NOTE — ED Notes (Signed)
Pt on presentation appears very anxious, c/o of chest pain that stays in the left side of his chest with no radiation.  Pt denies pain at this time.

## 2014-05-24 NOTE — ED Notes (Signed)
Unable to take report at this time.  Charge RN Kristi to call back for report.

## 2014-05-24 NOTE — Consult Note (Signed)
CONSULT NOTE  Date: 05/24/2014               Patient Name:  Kelly Gates MRN: [id number]  DOB: 1961/02/13 Age / Sex: 53 y.o., male        PCP: No PCP Per Patient Primary Cardiologist: New/Nahser            Referring Physician: Toniann Fail              Reason for Consult: Chest pain            History of Present Illness: Patient is a 53 y.o. male with no significant past medical history, who was admitted to [facility] on 05/24/2014 for evaluation of chest discomfort.   The chest pain / pressure woke him from sleep.    The pain was described as a chest pressure it was associated with diaphoresis.    He has a hx of CP in the past - has had a negative coronary CT angio in 2007.  He thinks he may have had a stress test at our office  ( results not found).  He was having chest pressure this morning on admission. His EKG showed mild ST segment depression. After treatment he became pain-free and his ST segment depression resolved. He's currently pain-free this morning. His troponin levels are negative.  A CT angiogram of the chest which was negative for pulmonary embolus.   Medications: Outpatient medications: No prescriptions prior to admission    Current medications: Current Facility-Administered Medications  Medication Dose Route Frequency Provider Last Rate Last Dose  . aspirin EC tablet 325 mg  325 mg Oral Daily Eduard Clos, MD      . enoxaparin (LOVENOX) injection 40 mg  40 mg Subcutaneous Q24H Eduard Clos, MD      . hydrALAZINE (APRESOLINE) injection 10 mg  10 mg Intravenous Q4H PRN Eduard Clos, MD      . morphine 2 MG/ML injection 2 mg  2 mg Intravenous Q2H PRN Eduard Clos, MD      . nitroGLYCERIN (NITROSTAT) SL tablet 0.4 mg  0.4 mg Sublingual Q5 min PRN Eduard Clos, MD         Allergies  Allergen Reactions  . Penicillins Other (See Comments)    unknown     Past Medical History  Diagnosis Date  . Medical history  non-contributory     Past Surgical History  Procedure Laterality Date  . Appendectomy      Family History  Problem Relation Age of Onset  . Stroke Mother     Social History:  reports that he has been smoking.  He does not have any smokeless tobacco history on file. He reports that he drinks alcohol. He reports that he does not use illicit drugs.   Review of Systems: Constitutional:  denies fever, chills, diaphoresis, appetite change and fatigue.  HEENT: denies photophobia, eye pain, redness, hearing loss, ear pain, congestion, sore throat, rhinorrhea, sneezing, neck pain, neck stiffness and tinnitus.  Respiratory: denies SOB, DOE, cough, chest tightness, and wheezing.  Cardiovascular: admits to chest pain.  He denies  palpitations and leg swelling.  Gastrointestinal: denies nausea, vomiting, abdominal pain, diarrhea, constipation, blood in stool.  Genitourinary: denies dysuria, urgency, frequency, hematuria, flank pain and difficulty urinating.  Musculoskeletal: denies  myalgias, back pain, joint swelling, arthralgias and gait problem.   Skin: denies pallor, rash and wound.  Neurological: denies dizziness, seizures, syncope, weakness, light-headedness, numbness and headaches.  Hematological: denies adenopathy, easy bruising, personal or family bleeding history.  Psychiatric/ Behavioral: denies suicidal ideation, mood changes, confusion, nervousness, sleep disturbance and agitation.    Physical Exam: BP 150/99  Pulse 91  Temp(Src) 98 F (36.7 C) (Oral)  Resp 16  Ht 6\' 4"  (1.93 m)  Wt 200 lb (90.719 kg)  BMI 24.35 kg/m2  SpO2 94%  Wt Readings from Last 3 Encounters:  05/24/14 200 lb (90.719 kg)    General: Vital signs reviewed and noted. Well-developed, well-nourished, in no acute distress; alert,   Head: Normocephalic, atraumatic, sclera anicteric,   Neck: Supple. Negative for carotid bruits. No JVD   Lungs:  Clear bilaterally, no  wheezes, rales, or rhonchi.  Breathing is normal   Heart: RRR with S1 S2. No murmurs, rubs, or gallops   Abdomen:  Soft, non-tender, non-distended with normoactive bowel sounds. No hepatomegaly. No rebound/guarding. No obvious abdominal masses   MSK: Strength and the appear normal for age.   Extremities: No clubbing or cyanosis. No edema.  Distal pedal pulses are 2+ and equal   Neurologic: Alert and oriented X 3. Moves all extremities spontaneously.  Psych: Responds to questions appropriately with a normal affect.     Lab results: Basic Metabolic Panel:  Recent Labs Lab 05/24/14 0334  NA 141  K 3.8  CL 105  CO2 23  GLUCOSE 93  BUN 9  CREATININE 0.91  CALCIUM 9.0    Liver Function Tests: No results found for this basename: AST, ALT, ALKPHOS, BILITOT, PROT, ALBUMIN,  in the last 168 hours No results found for this basename: LIPASE, AMYLASE,  in the last 168 hours No results found for this basename: AMMONIA,  in the last 168 hours  CBC:  Recent Labs Lab 05/24/14 0334  WBC 7.7  HGB 15.8  HCT 44.1  MCV 95.5  PLT 214    Cardiac Enzymes: No results found for this basename: CKTOTAL, CKMB, CKMBINDEX, TROPONINI,  in the last 168 hours  BNP: No components found with this basename: POCBNP,   CBG: No results found for this basename: GLUCAP,  in the last 168 hours  Coagulation Studies: No results found for this basename: LABPROT, INR,  in the last 72 hours   Other results: EKG:   NSR, mild ST depression in the inferior leads.   Imaging: Ct Angio Chest Pe W/cm &/or Wo Cm  05/24/2014   CLINICAL DATA:  Chest pain, shortness of breath.  EXAM: CT ANGIOGRAPHY CHEST WITH CONTRAST  TECHNIQUE: Multidetector CT imaging of the chest was performed using the standard protocol during bolus administration of intravenous contrast. Multiplanar CT image reconstructions and MIPs were obtained to evaluate the vascular anatomy.  CONTRAST:  80mL OMNIPAQUE IOHEXOL 350 MG/ML SOLN  COMPARISON:  DG CHEST 1V PORT dated  05/24/2014  FINDINGS: Adequate contrast opacification of the pulmonary artery's. Main pulmonary artery is not enlarged. No pulmonary arterial filling defects to the level of the subsegmental branches.  Heart and pericardium are unremarkable, no right heart strain. Thoracic aorta is normal course and caliber, mild calcific atherosclerosis of the aortic arch. No lymphadenopathy by CT size criteria. Tracheobronchial tree is patent, no pneumothorax. Mild apical paraseptal emphysema. No pleural effusions or focal consolidations.  Included view of the abdomen is unremarkable. Visualized soft tissues and included osseous structures appear normal.  Review of the MIP images confirms the above findings.  IMPRESSION: No acute pulmonary embolism.  Mild biapical paraseptal emphysema without acute cardiopulmonary process.   Electronically Signed   By:  Courtnay  Bloomer   On: 05/24/2014 05:18   Dg Chest Port 1 View  05/24/2014   CLINICAL DATA:  Shortness of breath.  EXAM: PORTABLE CHEST - 1 VIEW  COMPARISON:  Chest radiograph performed 06/26/2006  FINDINGS: The lungs are well-aerated and clear. There is no evidence of focal opacification, pleural effusion or pneumothorax. The left costophrenic angle is incompletely imaged on this study.  The cardiomediastinal silhouette is within normal limits. No acute osseous abnormalities are seen.  IMPRESSION: No acute cardiopulmonary process seen.   Electronically Signed   By: Roanna RaiderJeffery  Chang M.D.   On: 05/24/2014 03:55         Assessment & Plan: . Patient Active Problem List   Diagnosis Date Noted  . Chest pain 05/24/2014  . Elevated blood pressure 05/24/2014   1:  Chest discomfort: Kelly HaJonathan presents with episodes of chest pressure and chest pain. He had mild ST segment depression with these pains I think that he needs a cardiac catheterization. He's had a CT angiogram past which was negative. He has continued to smoke and is at risk of coronary artery disease.  We have  discussed the risks, benefits, and options concerning cardiac cath.   He understands and agrees to proceed.  2. Hypertension:  He Eats a lot of fast food. 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 We'll start him on lisinopril 10 mg a day.   Vesta MixerPhilip J. Nahser, Montez HagemanJr., MD, [facility] CenterFACC 05/24/2014, 8:01 AM Office - [phone] Pager [phone]

## 2014-05-24 NOTE — Discharge Instructions (Addendum)
°Emergency Department Resource Guide °1) Find a Doctor and Pay Out of Pocket °Although you won't have to find out who is covered by your insurance plan, it is a good idea to ask around and get recommendations. You will then need to call the office and see if the doctor you have chosen will accept you as a new patient and what types of options they offer for patients who are self-pay. Some doctors offer discounts or will set up payment plans for their patients who do not have insurance, but you will need to ask so you aren't surprised when you get to your appointment. ° °2) Contact Your Local Health Department °Not all health departments have doctors that can see patients for sick visits, but many do, so it is worth a call to see if yours does. If you don't know where your local health department is, you can check in your phone book. The CDC also has a tool to help you locate your state's health department, and many state websites also have listings of all of their local health departments. ° °3) Find a Walk-in Clinic °If your illness is not likely to be very severe or complicated, you may want to try a walk in clinic. These are popping up all over the country in pharmacies, drugstores, and shopping centers. They're usually staffed by nurse practitioners or physician assistants that have been trained to treat common illnesses and complaints. They're usually fairly quick and inexpensive. However, if you have serious medical issues or chronic medical problems, these are probably not your best option. ° °No Primary Care Doctor: °- Call [facility] at  832-8000 - they can help you locate a primary care doctor that  accepts your insurance, provides certain services, etc. °- Physician Referral Service- 1-800-533-3463 ° °Chronic Pain Problems: °Organization         Address  Phone   Notes  °[facility]  ([phone]) [phone] Patients need to be referred by their primary care doctor.  ° °Medication  Assistance: °Organization         Address  Phone   Notes  °[place] [facility] [address] °[place], [place] [address] (336) 641-8030 --Must be a resident of [place] °-- Must have NO insurance coverage whatsoever (no Medicaid/ Medicare, etc.) °-- The pt. MUST have a primary care doctor that directs their care regularly and follows them in the community °  °[facility]  (866) 331-1348   °[employer]  (888) 892-1162   ° °Agencies that provide inexpensive medical care: °Organization         Address  Phone   Notes  °[facility]  (336) 832-8035   °[facility]    (336) 832-7272   °[facility] [address] °[place], [place] [address] (336) 832-4777   °[facility] [address], °[place] (336) 271-4999   °[facility]    (336) 373-0678   °[facility]    (336) 272-1050   °[facility] ° [address], [place] Phone:  (336) 832-4444, Fax:  (336) 832-4440 Hours of Operation:  9 am - 6 pm, M-F.  Also accepts Medicaid/Medicare and self-pay.  °[facility] ° [address], [place] Phone: (336) [phone], Fax: (336) [phone]. Hours of Operation:  8:30 am - 5:30 pm, M-F.  Also accepts Medicaid and self-pay.  °[facility] [place] [address], [place] Phone: (336) 878-6027   °[facility] [address], [place], [place] (336)723-1848, Ext. 123 Mondays & Thursdays: 7-9 AM.  First 15 patients are seen on a first come, first serve basis. °  ° °Medicaid-accepting [place] Providers: ° °Organization         Address  Phone   Notes  °[facility] [address], [place] (336) [phone] Also accepts self-pay patients.  °[facility] [address], [place] ° (336) [phone]   °[facility] [address], [place]  (336) 288-8857   °[facility] [address] [place] [address], [place] (336) 299-7000   °Veita Bland [address], [place]  ° (336) 373-1557 Only accepts [place] Access Medicaid patients after they have their name applied to their card.  ° °Self-Pay (no insurance) in [place]: ° °Organization         Address  Phone   Notes  °Sickle Cell Patients, [facility] [address], [place] (336) 832-1970   °[facility] [address], [place] (336) 832-4400   °[facility] ° [address], [place] (336) 992-4800   °[facility]/Dr. Osei-Bonsu ° [address] [place] [address], [place] or [address], [place] (336) [phone] Phone number for both [place] and [place] locations is the same.  °[facility] [address], [place] (336) 299-0000   °[facility] [address] [place] [address], [place] or [address] (336) 852-7530 °(336) 878-2260   °[facility] [address], [place] (336) 350-1642, phone; (336) 294-5005, fax Sees patients 1st and 3rd Saturday of every month.  Must not qualify for public or private insurance (i.e. Medicaid, Medicare, [place] Health Choice, Veterans' Benefits) • Household income should be no more than 200% of the poverty level •The clinic cannot treat you if you are pregnant or think you are pregnant • Sexually transmitted diseases are not treated at the clinic.  ° ° °Dental Care: °Organization         Address  Phone  Notes  °[place] [facility] [address], [place] (336) 641-6152 Accepts children up to age 21 who are enrolled in Medicaid or [place] Health Choice; pregnant women with a Medicaid card; and children who have applied for Medicaid or [place] Health Choice, but were declined, whose parents can pay a reduced fee at time of service.  °[place]  [facility] [place]  [address], [place] (336) 641-7733 Accepts children up to age 21 who are enrolled in Medicaid or [place] Health Choice; pregnant women with a Medicaid card; and children who have applied for Medicaid or [place] Health Choice, but were declined, whose parents can pay a reduced fee at time of service.  °[facility] ° [address], [place] (336) 641-4533 Patients are seen by appointment only. Walk-ins are not accepted. [facility] will see patients 18 years of age and older. °Monday - Tuesday (8am-5pm) °Most Wednesdays (8:30-5pm) °$30 per visit, cash only  °[facility] ° [address], [place] (336) [phone] Patients are seen by appointment only. Walk-ins are not accepted. [facility] will see patients 18 years of age and older. °One   Wednesday Evening (Monthly: Volunteer Based).  $30 per visit, cash only  °[facility]  ([phone]) [phone] for adults; Children under age 4, call Graduate Pediatric Dentistry at ([phone]) [phone]. Children aged 4-14, please call ([phone]) [phone] to request a pediatric application. ° Dental services are provided in all areas of dental care including fillings, crowns and bridges, complete and partial dentures, implants, gum treatment, root canals, and extractions. Preventive care is also provided. Treatment is provided to both adults and children. °Patients are selected via a lottery and there is often a waiting list. °  °[facility] [address], °[place] ° (336) 763-8833 [email or web address] °  °[facility] [address], [place], [place] (336)723-1848, Ext. 123 Second and Fourth Thursday of each month, opens at 6:30 AM; Clinic ends at 9 AM.  Patients are seen on a first-come first-served basis, and a limited number are seen during each clinic.  ° °[facility] ° [address], [place], [place] (336) [phone]    Eligibility Requirements °You must have lived in [place], or [place] counties for at least the last three months. °  You cannot be eligible for state or federal sponsored healthcare insurance, including Veterans Administration, Medicaid, or Medicare. °  You generally cannot be eligible for healthcare insurance through your employer.  °  How to apply: °Eligibility screenings are held every Tuesday and Wednesday afternoon from 1:00 pm until 4:00 pm. You do not need an appointment for the interview!  °[facility] [address], [place], [place] 336-631-2330   °[facility]  336-342-8273   °[facility]  336-703-3100   °[facility]  336-570-6415   ° °Behavioral Health Resources in the Community: °Intensive Outpatient Programs °Organization         Address  Phone  Notes  °[facility] [address], [place], [place] 336-878-6098   °[facility] [address], [place], [place] 336-832-9800   °[facility] [address], [place], [place] ° 336-882-2125   °[facility] [address],  °[place], [place] 1-800-853-5163 or 336-641-4981   °Substance Abuse Resources °Organization         Address  Phone  Notes  °[facility]  336-882-2125   °[facility]  336-784-9470   °[facility]  336-285-9073   °[facility]  336-845-3988   °Residential & Outpatient Substance Abuse Program  1-800-659-3381   °Psychological Services °Organization         Address  Phone  Notes  °[facility]  336- 832-9600   °[facility]  336- 378-7881   °[facility] [address], [place] 1-800-853-5163 or 336-641-4981   ° °Mobile Crisis Teams °Organization         Address  Phone  Notes  °[facility], Mobile Crisis Care Unit  1-877-626-1772   °Assertive °[facility] ° [address]  [place], [place] 336-834-9664   °Sharon DeEsch [address] °[place] 336-554-5454   ° °Self-Help/Support Groups °Organization         Address  Phone             Notes  °[facility]  - variety of support groups  336- 373-1402 Call for more information  °Narcotics Anonymous (NA), [facility] [address], °[place]  2 meetings at this location  ° °  Residential Treatment Programs Organization         Address  Phone  Notes  [facility] [address]    [place]  1-610-960-4540   [facility]  [address], [place] [address], [place], [place] [phone]   [facility] [address] [place], [place] [phone] Admissions: 8am-3pm M-F  [facility] [address]    [place], [place] 295-284-1324   [facility] [address] [place], [place], [place] [phone]   [facility] [address],  [place], [place] [phone]   [facility] Intensive Outpatient [address], [place], [place] [phone]   [facility] [address]  [place], [place] 3-295-188-4166 or 302-860-1689   [facility] [address], [place], [place] 323-557-3220 Accepts Medicaid  [facility] [address]  [place] 2-542-706-2376 Substance Abuse/Addiction Treatment   [facility]         Address  Phone  Notes  [facility]  (262)272-3224   Angie Fava, PhD [address] [place], [place]   (475) [phone] or [phone]   [facility]   [address] [place], [place] [phone]   [facility] [address], [place], [place] (480)[phone] Insurance/Medicaid/sponsorship through [facility] [address]                                    [place], [place] [phone] Therapy/tele-psych/case    [facility] [address], [place] [phone]    Dr. Lolly Mustache  [phone]   [facility]  [employer] [facility] Dept. 1) [address], [place] 2) [address] [place] 3)  [address], [place] [phone]  [phone]   [facility] Child Abuse Hotline 743-692-8073 or [phone] (After Hours)       Cholesterol Cholesterol is a type of fat. Your body needs a small amount of cholesterol, but too much can cause health problems. Certain problems include heart attacks, strokes, and not enough blood flow to your heart, brain, kidneys, or feet. You get cholesterol in 2 ways:  Naturally.  By eating certain foods. HOME CARE  Eat a low-fat diet:  Eat less eggs, whole dairy products (whole milk, cheese, and butter), fatty meats, and fried foods.  Eat more fruits, vegetables, whole-wheat breads, lean chicken, and fish.  Follow your exercise program as told by your doctor.  Keep your weight at a healthy level. Talk to your doctor about what is right for you.  Only take medicine as told by your doctor.  Get your cholesterol checked once a year or as told by your doctor. MAKE SURE YOU:  Understand these instructions.  Will watch your condition.  Will get help right away if you are not doing well or get worse. Document Released: 03/14/2009 Document Revised: 03/09/2012 Document Reviewed: 09/29/2013 [facility] Patient Information 2014 [place], [place].     Fat and Cholesterol Control Diet Your diet has an affect on your fat and cholesterol levels in your blood and organs. Too much fat and cholesterol in your blood can affect your:  Heart.  Blood vessels (arteries, veins).  Gallbladder.  Liver.  Pancreas. CONTROL  FAT AND CHOLESTEROL WITH DIET Certain foods raise cholesterol and others lower it. It is important to replace bad fats with other types of fat.  Do not eat:  Fatty  meats, such as hot dogs and salami.  Stick margarine and some tub margarines that have "partially hydrogenated oils" in them.  Baked goods, such as cookies and crackers that have "partially hydrogenated oils" in them.  Saturated tropical oils, such as coconut and palm oil. Eat the following foods:  Round or loin cuts of red meat.  Chicken (without skin).  Fish.  Veal.  Ground [place] breast.  Shellfish.  Fruit, such as apples.  Vegetables, such as broccoli, potatoes, and carrots.  Beans, peas, and lentils (legumes).  Grains, such as barley, rice, couscous, and bulgar wheat.  Pasta (without cream sauces). Look for foods that are nonfat, low in fat, and low in cholesterol.  FIND FOODS THAT ARE LOWER IN FAT AND CHOLESTEROL  Find foods with soluble fiber and plant sterols (phytosterol). You should eat 2 grams a day of these foods. These foods include:  Fruits.  Vegetables.  Whole grains.  Dried beans and peas.  Nuts and seeds.  Read package labels. Look for low-saturated fats, trans fat free, low-fat foods.  Choose cheese that have only 2 to 3 grams of saturated fat per ounce.  Use heart-healthy tub margarine that is free of trans fat or partially hydrogenated oil.  Avoid buying baked goods that have partially hydrogenated oils in them. Instead, buy baked goods made with whole grains (whole-wheat or whole oat flour). Avoid baked goods labeled with "flour" or "enriched flour."  Buy non-creamy canned soups with reduced salt and no added fats. PREPARING YOUR FOOD  Broil, bake, steam, or roast foods. Do not fry food.  Use non-stick cooking sprays.  Use lemon or herbs to flavor food instead of using butter or stick margarine.  Use nonfat yogurt, salsa, or low-fat dressings for salads. LOW-SATURATED FAT / LOW-FAT FOOD SUBSTITUTES  Meats / Saturated Fat (g)  Avoid: Steak, marbled (3 oz/85 g) / 11 g.  Choose: Steak, lean (3 oz/85 g) / 4 g.  Avoid: Hamburger (3  oz/85 g) / 7 g.  Choose: Hamburger, lean (3 oz/85 g) / 5 g.  Avoid: Ham (3 oz/85 g) / 6 g.  Choose: Ham, lean cut (3 oz/85 g) / 2.4 g.  Avoid: Chicken, with skin, dark meat (3 oz/85 g) / 4 g.  Choose: Chicken, skin removed, dark meat (3 oz/85 g) / 2 g.  Avoid: Chicken, with skin, light meat (3 oz/85 g) / 2.5 g.  Choose: Chicken, skin removed, light meat (3 oz/85 g) / 1 g. Dairy / Saturated Fat (g)  Avoid: Whole milk (1 cup) / 5 g.  Choose: Low-fat milk, 2% (1 cup) / 3 g.  Choose: Low-fat milk, 1% (1 cup) / 1.5 g.  Choose: Skim milk (1 cup) / 0.3 g.  Avoid: Hard cheese (1 oz/28 g) / 6 g.  Choose: Skim milk cheese (1 oz/28 g) / 2 to 3 g.  Avoid: Cottage cheese, 4% fat (1 cup) / 6.5 g.  Choose: Low-fat cottage cheese, 1% fat (1 cup) / 1.5 g.  Avoid: Ice cream (1 cup) / 9 g.  Choose: Sherbet (1 cup) / 2.5 g.  Choose: Nonfat frozen yogurt (1 cup) / 0.3 g.  Choose: Frozen fruit bar / trace.  Avoid: Whipped cream (1 tbs) / 3.5 g.  Choose: Nondairy whipped topping (1 tbs) / 1 g. Condiments /  Saturated Fat (g)  Avoid: Mayonnaise (1 tbs) / 2 g.  Choose: Low-fat mayonnaise (1 tbs) / 1 g.  Avoid: Butter (1 tbs) / 7 g.  Choose: Extra light margarine (1 tbs) / 1 g.  Avoid: Coconut oil (1 tbs) / 11.8 g.  Choose: Olive oil (1 tbs) / 1.8 g.  Choose: Corn oil (1 tbs) / 1.7 g.  Choose: Safflower oil (1 tbs) / 1.2 g.  Choose: Sunflower oil (1 tbs) / 1.4 g.  Choose: Soybean oil (1 tbs) / 2.4 g .  Choose: Canola oil (1 tbs) / 1 g. Document Released: 06/16/2012 Document Revised: 08/18/2013 Document Reviewed: 06/16/2012 [facility] Patient Information 2014 [place], [place].       Lisinopril tablets What is this medicine? LISINOPRIL (lyse IN oh pril) is an ACE inhibitor. This medicine is used to treat high blood pressure and heart failure. It is also used to protect the heart immediately after a heart attack. This medicine may be used for other purposes; ask your  health care provider or pharmacist if you have questions. COMMON BRAND NAME(S): Prinivil, Zestril What should I tell my health care provider before I take this medicine? They need to know if you have any of these conditions: -diabetes -heart or blood vessel disease -immune system disease like lupus or scleroderma -kidney disease -low blood pressure -previous swelling of the tongue, face, or lips with difficulty breathing, difficulty swallowing, hoarseness, or tightening of the throat -an unusual or allergic reaction to lisinopril, other ACE inhibitors, insect venom, foods, dyes, or preservatives -pregnant or trying to get pregnant -breast-feeding How should I use this medicine? Take this medicine by mouth with a glass of water. Follow the directions on your prescription label. You may take this medicine with or without food. Take your medicine at regular intervals. Do not stop taking this medicine except on the advice of your doctor or health care professional. Talk to your pediatrician regarding the use of this medicine in children. Special care may be needed. While this drug may be prescribed for children as young as 64 years of age for selected conditions, precautions do apply. Overdosage: If you think you have taken too much of this medicine contact a poison control center or emergency room at once. NOTE: This medicine is only for you. Do not share this medicine with others. What if I miss a dose? If you miss a dose, take it as soon as you can. If it is almost time for your next dose, take only that dose. Do not take double or extra doses. What may interact with this medicine? -diuretics -lithium -NSAIDs, medicines for pain and inflammation, like ibuprofen or naproxen -over-the-counter herbal supplements like hawthorn -potassium salts or potassium supplements -salt substitutes This list may not describe all possible interactions. Give your health care provider a list of all the  medicines, herbs, non-prescription drugs, or dietary supplements you use. Also tell them if you smoke, drink alcohol, or use illegal drugs. Some items may interact with your medicine. What should I watch for while using this medicine? Visit your doctor or health care professional for regular check ups. Check your blood pressure as directed. Ask your doctor what your blood pressure should be, and when you should contact him or her. Call your doctor or health care professional if you notice an irregular or fast heart beat. Women should inform their doctor if they wish to become pregnant or think they might be pregnant. There is a potential for serious side effects  to an unborn child. Talk to your health care professional or pharmacist for more information. Check with your doctor or health care professional if you get an attack of severe diarrhea, nausea and vomiting, or if you sweat a lot. The loss of too much body fluid can make it dangerous for you to take this medicine. You may get drowsy or dizzy. Do not drive, use machinery, or do anything that needs mental alertness until you know how this drug affects you. Do not stand or sit up quickly, especially if you are an older patient. This reduces the risk of dizzy or fainting spells. Alcohol can make you more drowsy and dizzy. Avoid alcoholic drinks. Avoid salt substitutes unless you are told otherwise by your doctor or health care professional. Do not treat yourself for coughs, colds, or pain while you are taking this medicine without asking your doctor or health care professional for advice. Some ingredients may increase your blood pressure. What side effects may I notice from receiving this medicine? Side effects that you should report to your doctor or health care professional as soon as possible: -abdominal pain with or without nausea or vomiting -allergic reactions like skin rash or hives, swelling of the hands, feet, face, lips, throat, or  tongue -dark urine -difficulty breathing -dizzy, lightheaded or fainting spell -fever or sore throat -irregular heart beat, chest pain -pain or difficulty passing urine -redness, blistering, peeling or loosening of the skin, including inside the mouth -unusually weak -yellowing of the eyes or skin Side effects that usually do not require medical attention (report to your doctor or health care professional if they continue or are bothersome): -change in taste -cough -decreased sexual function or desire -headache -sun sensitivity -tiredness This list may not describe all possible side effects. Call your doctor for medical advice about side effects. You may report side effects to FDA at 1-800-FDA-1088. Where should I keep my medicine? Keep out of the reach of children. Store at room temperature between 15 and 30 degrees C (59 and 86 degrees F). Protect from moisture. Keep container tightly closed. Throw away any unused medicine after the expiration date. NOTE: This sheet is a summary. It may not cover all possible information. If you have questions about this medicine, talk to your doctor, pharmacist, or health care provider.  2014, Elsevier/Gold Standard. (2008-06-20 17:36:32)    Radial Site Care Refer to this sheet in the next few weeks. These instructions provide you with information on caring for yourself after your procedure. Your caregiver may also give you more specific instructions. Your treatment has been planned according to current medical practices, but problems sometimes occur. Call your caregiver if you have any problems or questions after your procedure. HOME CARE INSTRUCTIONS  You may shower the day after the procedure.Remove the bandage (dressing) and gently wash the site with plain soap and water.Gently pat the site dry.  Do not apply powder or lotion to the site.  Do not submerge the affected site in water for 3 to 5 days.  Inspect the site at least twice  daily.  Do not flex or bend the affected arm for 24 hours.  No lifting over 5 pounds (2.3 kg) for 5 days after your procedure.  Do not drive home if you are discharged the same day of the procedure. Have someone else drive you.  You may drive 24 hours after the procedure unless otherwise instructed by your caregiver.  Do not operate machinery or power tools for 24 hours.  A responsible adult should be with you for the first 24 hours after you arrive home. What to expect:  Any bruising will usually fade within 1 to 2 weeks.  Blood that collects in the tissue (hematoma) may be painful to the touch. It should usually decrease in size and tenderness within 1 to 2 weeks. SEEK IMMEDIATE MEDICAL CARE IF:  You have unusual pain at the radial site.  You have redness, warmth, swelling, or pain at the radial site.  You have drainage (other than a small amount of blood on the dressing).  You have chills.  You have a fever or persistent symptoms for more than 72 hours.  You have a fever and your symptoms suddenly get worse.  Your arm becomes pale, cool, tingly, or numb.  You have heavy bleeding from the site. Hold pressure on the site. Document Released: 01/18/2011 Document Revised: 03/09/2012 Document Reviewed: 01/18/2011 [facility] Patient Information 2014 [place], [place].    Simvastatin tablets What is this medicine? SIMVASTATIN (SIM va stat in) is known as a HMG-CoA reductase inhibitor or 'statin'. It lowers the level of cholesterol and triglycerides in the blood. This drug may also reduce the risk of heart attack, stroke, or other health problems in patients with risk factors for heart or blood vessel disease. Diet and lifestyle changes are often used with this drug. This medicine may be used for other purposes; ask your health care provider or pharmacist if you have questions. COMMON BRAND NAME(S): Zocor What should I tell my health care provider before I take this medicine? They  need to know if you have any of these conditions: -frequently drink alcoholic beverages -kidney disease -liver disease -muscle aches or weakness -other medical condition -an unusual or allergic reaction to simvastatin, other medicines, foods, dyes, or preservatives -pregnant or trying to get pregnant -breast-feeding How should I use this medicine? Take this medicine by mouth with a glass of water. Follow the directions on the prescription label. You can take this medicine with or without food. Take your doses at regular intervals. Do not take your medicine more often than directed. Talk to your pediatrician regarding the use of this medicine in children. Special care may be needed. Overdosage: If you think you have taken too much of this medicine contact a poison control center or emergency room at once. NOTE: This medicine is only for you. Do not share this medicine with others. What if I miss a dose? If you miss a dose, take it as soon as you can. If it is almost time for your next dose, take only that dose. Do not take double or extra doses. What may interact with this medicine? Do not take this medicine with any of the following medications: -boceprevir -cyclosporine -danazol -gemfibrozil -medicines for fungal infections like itraconazole, ketoconazole, posaconazole, voriconazole -medicines for HIV infection -mifepristone, RU-486 -nefazodone -red yeast rice -some antibiotics like clarithromycin, erythromycin, telithromycin -telaprevir This medicine may also interact with the following medications: -alcohol -amiodarone -amlodipine -colchicine -digoxin -diltiazem -dronedarone -fluconazole -grapefruit juice -niacin -ranolazine -verapamil -warfarin This list may not describe all possible interactions. Give your health care provider a list of all the medicines, herbs, non-prescription drugs, or dietary supplements you use. Also tell them if you smoke, drink alcohol, or use  illegal drugs. Some items may interact with your medicine. What should I watch for while using this medicine? Visit your doctor or health care professional for regular check-ups. You may need regular tests to make sure your liver is  working properly. Tell your doctor or health care professional right away if you get any unexplained muscle pain, tenderness, or weakness, especially if you also have a fever and tiredness. Your doctor or health care professional may tell you to stop taking this medicine if you develop muscle problems. If your muscle problems do not go away after stopping this medicine, contact your health care professional. This medicine may affect blood sugar levels. If you have diabetes, check with your doctor or health care professional before you change your diet or the dose of your diabetic medicine. This drug is only part of a total heart-health program. Your doctor or a dietician can suggest a low-cholesterol and low-fat diet to help. Avoid alcohol and smoking, and keep a proper exercise schedule. Do not use this drug if you are pregnant or breast-feeding. Serious side effects to an unborn child or to an infant are possible. Talk to your doctor or pharmacist for more information. If you are going to have surgery tell your health care professional that you are taking this drug. Some drugs may increase the risk of side effects from simvastatin. If you are given certain antibiotics or antifungals, your doctor or health care professional may stop simvastatin for a short time. Check with your doctor or pharmacist for advice. What side effects may I notice from receiving this medicine? Side effects that you should report to your doctor or health care professional as soon as possible: -allergic reactions like skin rash, itching or hives, swelling of the face, lips, or tongue -confusion -dark urine -fever -joint pain -loss of memory -muscle cramps, pain -redness, blistering, peeling or  loosening of the skin, including inside the mouth -trouble passing urine or change in the amount of urine -unusually weak or tired -yellowing of the eyes or skin Side effects that usually do not require medical attention (report to your doctor or health care professional if they continue or are bothersome): -constipation -heartburn -stomach gas, pain, upset This list may not describe all possible side effects. Call your doctor for medical advice about side effects. You may report side effects to FDA at 1-800-FDA-1088. Where should I keep my medicine? Keep out of the reach of children. Store at room temperature below 30 degrees C (86 degrees F). Keep container tightly closed. Throw away any unused medicine after the expiration date. NOTE: This sheet is a summary. It may not cover all possible information. If you have questions about this medicine, talk to your doctor, pharmacist, or health care provider.  2014, Elsevier/Gold Standard. (2011-11-05 10:40:36)      Metoprolol tablets What is this medicine? METOPROLOL (me TOE proe lole) is a beta-blocker. Beta-blockers reduce the workload on the heart and help it to beat more regularly. This medicine is used to treat high blood pressure and to prevent chest pain. It is also used to after a heart attack and to prevent an additional heart attack from occurring. This medicine may be used for other purposes; ask your health care provider or pharmacist if you have questions. COMMON BRAND NAME(S): Lopressor What should I tell my health care provider before I take this medicine? They need to know if you have any of these conditions: -diabetes -heart or vessel disease like slow heart rate, worsening heart failure, heart block, sick sinus syndrome or Raynaud's disease -kidney disease -liver disease -lung or breathing disease, like asthma or emphysema -pheochromocytoma -thyroid disease -an unusual or allergic reaction to metoprolol, other  beta-blockers, medicines, foods, dyes, or preservatives -pregnant  or trying to get pregnant -breast-feeding How should I use this medicine? Take this medicine by mouth with a drink of water. Follow the directions on the prescription label. Take this medicine immediately after meals. Take your doses at regular intervals. Do not take more medicine than directed. Do not stop taking this medicine suddenly. This could lead to serious heart-related effects. Talk to your pediatrician regarding the use of this medicine in children. Special care may be needed. Overdosage: If you think you have taken too much of this medicine contact a poison control center or emergency room at once. NOTE: This medicine is only for you. Do not share this medicine with others. What if I miss a dose? If you miss a dose, take it as soon as you can. If it is almost time for your next dose, take only that dose. Do not take double or extra doses. What may interact with this medicine? This medicine may interact with the following medications: -certain medicines for blood pressure, heart disease, irregular heart beat -certain medicines for depression like monoamine oxidase (MAO) inhibitors, fluoxetine, or paroxetine -clonidine -dobutamine -epinephrine -isoproterenol -reserpine This list may not describe all possible interactions. Give your health care provider a list of all the medicines, herbs, non-prescription drugs, or dietary supplements you use. Also tell them if you smoke, drink alcohol, or use illegal drugs. Some items may interact with your medicine. What should I watch for while using this medicine? Visit your doctor or health care professional for regular check ups. Contact your doctor right away if your symptoms worsen. Check your blood pressure and pulse rate regularly. Ask your health care professional what your blood pressure and pulse rate should be, and when you should contact them. You may get drowsy or dizzy.  Do not drive, use machinery, or do anything that needs mental alertness until you know how this medicine affects you. Do not sit or stand up quickly, especially if you are an older patient. This reduces the risk of dizzy or fainting spells. Contact your doctor if these symptoms continue. Alcohol may interfere with the effect of this medicine. Avoid alcoholic drinks. What side effects may I notice from receiving this medicine? Side effects that you should report to your doctor or health care professional as soon as possible: -allergic reactions like skin rash, itching or hives -cold or numb hands or feet -depression -difficulty breathing -faint -fever with sore throat -irregular heartbeat, chest pain -rapid weight gain -swollen legs or ankles Side effects that usually do not require medical attention (report to your doctor or health care professional if they continue or are bothersome): -anxiety or nervousness -change in sex drive or performance -dry skin -headache -nightmares or trouble sleeping -short term memory loss -stomach upset or diarrhea -unusually tired This list may not describe all possible side effects. Call your doctor for medical advice about side effects. You may report side effects to FDA at 1-800-FDA-1088. Where should I keep my medicine? Keep out of the reach of children. Store at room temperature between 15 and 30 degrees C (59 and 86 degrees F). Throw away any unused medicine after the expiration date. NOTE: This sheet is a summary. It may not cover all possible information. If you have questions about this medicine, talk to your doctor, pharmacist, or health care provider.  2014, Elsevier/Gold Standard. (2013-08-20 14:40:36)

## 2014-05-24 NOTE — ED Notes (Signed)
Patient transported to CT 

## 2014-05-25 ENCOUNTER — Ambulatory Visit: Payer: Self-pay | Admitting: Internal Medicine

## 2014-12-08 ENCOUNTER — Encounter (HOSPITAL_COMMUNITY): Payer: Self-pay | Admitting: Cardiology

## 2014-12-28 ENCOUNTER — Encounter: Payer: Self-pay | Admitting: Cardiovascular Disease

## 2014-12-28 ENCOUNTER — Ambulatory Visit (INDEPENDENT_AMBULATORY_CARE_PROVIDER_SITE_OTHER): Payer: Managed Care, Other (non HMO) | Admitting: Cardiovascular Disease

## 2014-12-28 VITALS — BP 116/88 | HR 80 | Ht 76.0 in | Wt 201.8 lb

## 2014-12-28 DIAGNOSIS — I10 Essential (primary) hypertension: Secondary | ICD-10-CM

## 2014-12-28 DIAGNOSIS — R079 Chest pain, unspecified: Secondary | ICD-10-CM

## 2014-12-28 DIAGNOSIS — R5383 Other fatigue: Secondary | ICD-10-CM

## 2014-12-28 LAB — HEPATIC FUNCTION PANEL
ALT: 28 U/L (ref 0–53)
AST: 23 U/L (ref 0–37)
Albumin: 3.9 g/dL (ref 3.5–5.2)
Alkaline Phosphatase: 102 U/L (ref 39–117)
BILIRUBIN DIRECT: 0.1 mg/dL (ref 0.0–0.3)
Total Bilirubin: 0.6 mg/dL (ref 0.2–1.2)
Total Protein: 6.8 g/dL (ref 6.0–8.3)

## 2014-12-28 LAB — CBC WITH DIFFERENTIAL/PLATELET
BASOS ABS: 0 10*3/uL (ref 0.0–0.1)
BASOS PCT: 0.4 % (ref 0.0–3.0)
Eosinophils Absolute: 0.2 10*3/uL (ref 0.0–0.7)
Eosinophils Relative: 3.9 % (ref 0.0–5.0)
HCT: 45.7 % (ref 39.0–52.0)
HEMOGLOBIN: 15.4 g/dL (ref 13.0–17.0)
LYMPHS PCT: 31.4 % (ref 12.0–46.0)
Lymphs Abs: 2 10*3/uL (ref 0.7–4.0)
MCHC: 33.7 g/dL (ref 30.0–36.0)
MCV: 100.6 fl — ABNORMAL HIGH (ref 78.0–100.0)
MONOS PCT: 9 % (ref 3.0–12.0)
Monocytes Absolute: 0.6 10*3/uL (ref 0.1–1.0)
NEUTROS ABS: 3.5 10*3/uL (ref 1.4–7.7)
Neutrophils Relative %: 55.3 % (ref 43.0–77.0)
Platelets: 223 10*3/uL (ref 150.0–400.0)
RBC: 4.54 Mil/uL (ref 4.22–5.81)
RDW: 12.7 % (ref 11.5–15.5)
WBC: 6.3 10*3/uL (ref 4.0–10.5)

## 2014-12-28 LAB — BASIC METABOLIC PANEL
BUN: 12 mg/dL (ref 6–23)
CHLORIDE: 108 meq/L (ref 96–112)
CO2: 26 meq/L (ref 19–32)
Calcium: 9.1 mg/dL (ref 8.4–10.5)
Creatinine, Ser: 0.9 mg/dL (ref 0.4–1.5)
GFR: 98.28 mL/min (ref >60.00)
Glucose, Bld: 63 mg/dL — ABNORMAL LOW (ref 70–99)
Potassium: 4.2 mEq/L (ref 3.5–5.1)
Sodium: 141 mEq/L (ref 135–145)

## 2014-12-28 LAB — TSH: TSH: 1.77 u[IU]/mL (ref 0.35–4.50)

## 2014-12-28 MED ORDER — ROSUVASTATIN CALCIUM 10 MG PO TABS: 10.0000 mg | ORAL_TABLET | Freq: Every day | ORAL | Status: DC

## 2014-12-28 NOTE — Assessment & Plan Note (Signed)
At this point, I do not want him to re-start any of his BP meds.  He has not been taking them for several months and his BP is normal today. I think this is because of his dehydration and weight loss.   Will not start BP meds at this point. He will keep an eye on his BP .

## 2014-12-28 NOTE — Assessment & Plan Note (Signed)
He has not had any angina  Had a normal cath in May , 2015. Needs to eat and drink better.

## 2014-12-28 NOTE — Progress Notes (Signed)
Kelly DubonnetJonathan Gates Date of Birth  October 28, 1961       [facility]    [place] [facility] [address]  [address], [address] [place], [place]  [id number]   [place]  [id number] [phone]     [phone]   Fax  [phone]     Fax [phone]  Problem List: 1. Unstable angina 2. Hyperlipidemia 3. Hypertension  History of Present Illness:  Kelly Gates is a 53 year old gentleman with a history of hypertension, hyperlipidemia, and cigarette smoking. He was admitted to the hospital in May, 2015 with symptoms consistent with unstable angina. Cardiac catheterization reveals smooth and normal coronary arteries. He woke up christmas morning  5 days ago with onset of chest pain and diaphoresis.   He has had severe lack of energy.  Has felt sluggish.   Unable to work like he is used to.  [employer] [employer]. He's not been taking any of his medications in a long while ( did not tolerate the meds, he did not call and report these intolerances )   Still smoking - perhaps not as much as before.    Saw Dr. Clarene DukeLittle on Monday - did not have labs drawn. Admits that he has not been eating or drinking normally.   Sometimes only stops once a day to eat and drink .  Works as a Naval architecttruck driver,  Investment banker, operationalDelivers pre-fab walls for houses.    Current Outpatient Prescriptions on File Prior to Visit  Medication Sig Dispense Refill  . aspirin 81 MG chewable tablet Chew 1 tablet (81 mg total) by mouth daily. (Patient not taking: Reported on 12/28/2014) 30 tablet 1  . lisinopril (PRINIVIL,ZESTRIL) 10 MG tablet Take 1 tablet (10 mg total) by mouth daily. (Patient not taking: Reported on 12/28/2014) 31 tablet 0  . metoprolol tartrate (LOPRESSOR) 12.5 mg TABS tablet Take 0.5 tablets (12.5 mg total) by mouth 2 (two) times daily. (Patient not taking: Reported on 12/28/2014) 62 tablet 0  . simvastatin (ZOCOR) 20 MG tablet Take 1 tablet (20 mg total) by mouth daily at 6 PM. (Patient not taking: Reported on  12/28/2014) 30 tablet 0   No current facility-administered medications on file prior to visit.    Allergies  Allergen Reactions  . Penicillins Other (See Comments)    unknown    Past Medical History  Diagnosis Date  . Medical history non-contributory   . Tobacco abuse 05/24/2014    Past Surgical History  Procedure Laterality Date  . Appendectomy    . Left heart catheterization with coronary angiogram N/A 05/24/2014    Procedure: LEFT HEART CATHETERIZATION WITH CORONARY ANGIOGRAM;  Surgeon: Marykay Lexavid W Harding, MD;  [facility];  Service: Cardiovascular;  Laterality: N/A;    History  Smoking status  . Current Every Day Smoker  Smokeless tobacco  . Not on file    History  Alcohol Use  . Yes    Family History  Problem Relation Age of Onset  . Stroke Mother   . Hypertension Father     Reviw of Systems:  Reviewed in the HPI.  All other systems are negative.  Physical Exam: Blood pressure 116/88, pulse 80, height 6\' 4"  (1.93 m), weight 201 lb 12.8 oz (91.536 kg), SpO2 98 %. Wt Readings from Last 3 Encounters:  12/28/14 201 lb 12.8 oz (91.536 kg)  05/24/14 200 lb (90.719 kg)     General: Well developed, well nourished, in no acute distress.  Head: Normocephalic, atraumatic, sclera non-icteric,  mucus membranes are moist,   Neck: Supple. Carotids are 2 + without bruits. No JVD   Lungs: Clear   Heart: RR, normal S1S2  Abdomen: Soft, non-tender, non-distended with normal bowel sounds.  Msk:  Strength and tone are normal   Extremities: No clubbing or cyanosis. No edema.  Distal pedal pulses are 2+ and equal    Neuro: CN II - XII intact.  Alert and oriented X 3.   Psych:  Normal   ECG: Dec. 30, 2015:  NSR at 80, no ST or T wave changes.   Assessment / Plan:

## 2014-12-28 NOTE — Patient Instructions (Addendum)
Your physician recommends that you return for lab work in: TODAY (CBC/BMP/LIVER/TSH)  Your physician wants you to follow-up in: 6 months with Dr. Elease HashimotoNahser. You will receive a reminder letter in the mail two months in advance. If you don't receive a letter, please call our office to schedule the follow-up appointment.  Your physician has recommended you make the following change in your medication:  START Crestor 10 mg once daily (samples given)  We have removed other medications from patient's list due to patient is not taking

## 2014-12-28 NOTE — Assessment & Plan Note (Signed)
Abdomen complaint today is profound fatigue. He's not having chest pain. Does have diaphoresis. He simply has no energy to do all of his normal activity.  He admits that he is not eating or drinking readily. He's working at least 12 hours everyday as much as 14-16 on Sundays. He typically does not stop to eat or drink anything throughout the day.  Suspect it is gone completely. We'll check some labs today to make sure that is not anemic. We'll check a CBC, basic metabolic profile and a thyroid hormone.  He's had a cath several months ago which revealed smooth normal coronary arteries.  I have advised him to stop smoking. If he's not better in the next several weeks he is to  call. Otherwise I'll see him again in 6 months. We discussed ways  to eat better -  Take food with him through the day.

## 2015-01-04 ENCOUNTER — Encounter: Payer: Self-pay | Admitting: Cardiovascular Disease

## 2016-05-09 ENCOUNTER — Telehealth: Payer: Self-pay | Admitting: Cardiovascular Disease

## 2016-05-09 NOTE — Telephone Encounter (Signed)
LMTCB

## 2016-05-09 NOTE — Telephone Encounter (Signed)
Pt states he does not feel fast or irregular pulse when this happens.

## 2016-05-09 NOTE — Telephone Encounter (Signed)
Pt states for about 1 -1and 1/2 weeks he has had a sharp streak that is right in his heart, lasts for several seconds, feels like lightening through his heart. Pt states when this happens he has blurred vision, feels lightheaded, has a mild shakiness.  Pt states it can happen 1- 15 times a day, there is no pattern, occurs at rest. Pt denies any changes in the last 1-2 weeks. Pt does not know his heart rate or BP but is concerned his BP may be elevated.

## 2016-05-09 NOTE — Telephone Encounter (Signed)
I reviewed with Ward Givenshris Berge, NP  Per Chris-pt had cardiac cath with normal coronaries in May 2015. We don't really have an explanation for pt's symptoms, should have BP checked, suggest PCP or Urgent Care for further evaluation.  Pt advised, verbalized understanding, agreed with plan.

## 2016-05-09 NOTE — Telephone Encounter (Signed)
Pt calling re "streaks of pain through his heart" no energy-dizziness  X 1week, no sympotms since this am -pls advise  [phone]

## 2016-05-15 ENCOUNTER — Telehealth: Payer: Self-pay | Admitting: *Deleted

## 2016-05-15 NOTE — Telephone Encounter (Signed)
LM FOR PT TO CALL BACK.APPT  MADE  WITH MICHELE LENZE  PA  FOR  05-28-16 AT 10:00.TO  F/U  ON 20 MO  OLD  EKG  PER   PMD .NOT  SURE IF  THIS  DATE  IS GOOD FOR PT .Kelly Gates/CY

## 2016-05-17 NOTE — Telephone Encounter (Addendum)
Attempted to call patient's mobile number, unable to leave message Called work number and left message for patient to call back regarding an upcoming appointment

## 2016-05-20 ENCOUNTER — Telehealth: Payer: Self-pay | Admitting: Cardiovascular Disease

## 2016-05-20 NOTE — Telephone Encounter (Signed)
Spoke with patient who states he is returning call. I advised him of appointment with Jacolyn ReedyMichele Lenze, PA on 5/30. He states he will need to check his work schedule and call back to confirm appointment. I offered him Friday 5/24 with Dr. Elease HashimotoNahser but he states he is not able to come in on Friday.

## 2016-05-20 NOTE — Telephone Encounter (Signed)
New message ° ° ° ° ° °Returning a call to the nurse from friday °

## 2016-05-20 NOTE — Telephone Encounter (Signed)
Spoke with patient who states he is returning call. I advised him of appointment with Michele Lenze, PA on 5/30. He states he will need to check his work schedule and call back to confirm appointment. I offered him Friday 5/24 with Dr. Nahser but he states he is not able to come in on Friday.  

## 2016-05-24 NOTE — Telephone Encounter (Signed)
Called patient to check on appointment date/time.  He states he needs to cancel appointment with Tereso NewcomerScott Weaver, PA on 5/30.  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 He thanked me for the call.

## 2016-05-28 ENCOUNTER — Ambulatory Visit: Payer: Managed Care, Other (non HMO) | Admitting: Physician Assistant

## 2016-07-12 ENCOUNTER — Ambulatory Visit: Payer: Managed Care, Other (non HMO) | Admitting: Cardiovascular Disease

## 2019-07-09 ENCOUNTER — Other Ambulatory Visit (HOSPITAL_COMMUNITY): Payer: Self-pay | Admitting: Respiratory Therapy

## 2019-07-09 DIAGNOSIS — R0602 Shortness of breath: Secondary | ICD-10-CM

## 2019-11-11 ENCOUNTER — Other Ambulatory Visit: Payer: Self-pay

## 2019-11-11 ENCOUNTER — Emergency Department (HOSPITAL_BASED_OUTPATIENT_CLINIC_OR_DEPARTMENT_OTHER): Payer: 59

## 2019-11-11 ENCOUNTER — Emergency Department (HOSPITAL_COMMUNITY): Payer: 59

## 2019-11-11 ENCOUNTER — Observation Stay (HOSPITAL_COMMUNITY)
Admission: EM | Admit: 2019-11-11 | Discharge: 2019-11-12 | Disposition: A | Discharge status: Home / Self Care | Payer: 59 | Attending: Cardiology | Admitting: Cardiology

## 2019-11-11 ENCOUNTER — Encounter (HOSPITAL_COMMUNITY): Payer: Self-pay | Admitting: Emergency Medicine

## 2019-11-11 DIAGNOSIS — G4733 Obstructive sleep apnea (adult) (pediatric): Secondary | ICD-10-CM | POA: Insufficient documentation

## 2019-11-11 DIAGNOSIS — I10 Essential (primary) hypertension: Secondary | ICD-10-CM | POA: Diagnosis not present

## 2019-11-11 DIAGNOSIS — Z20828 Contact with and (suspected) exposure to other viral communicable diseases: Secondary | ICD-10-CM | POA: Insufficient documentation

## 2019-11-11 DIAGNOSIS — I4892 Unspecified atrial flutter: Secondary | ICD-10-CM

## 2019-11-11 DIAGNOSIS — Z720 Tobacco use: Secondary | ICD-10-CM

## 2019-11-11 DIAGNOSIS — F17210 Nicotine dependence, cigarettes, uncomplicated: Secondary | ICD-10-CM | POA: Insufficient documentation

## 2019-11-11 DIAGNOSIS — F1010 Alcohol abuse, uncomplicated: Secondary | ICD-10-CM

## 2019-11-11 DIAGNOSIS — Z789 Other specified health status: Secondary | ICD-10-CM

## 2019-11-11 DIAGNOSIS — G4739 Other sleep apnea: Secondary | ICD-10-CM

## 2019-11-11 DIAGNOSIS — E785 Hyperlipidemia, unspecified: Secondary | ICD-10-CM | POA: Diagnosis present

## 2019-11-11 DIAGNOSIS — I4891 Unspecified atrial fibrillation: Secondary | ICD-10-CM | POA: Diagnosis present

## 2019-11-11 DIAGNOSIS — Z716 Tobacco abuse counseling: Secondary | ICD-10-CM

## 2019-11-11 LAB — HIV ANTIBODY (ROUTINE TESTING W REFLEX): HIV Screen 4th Generation wRfx: NONREACTIVE

## 2019-11-11 LAB — COMPREHENSIVE METABOLIC PANEL
ALT: 42 U/L (ref 0–44)
AST: 36 U/L (ref 15–41)
Albumin: 3.8 g/dL (ref 3.5–5.0)
Alkaline Phosphatase: 110 U/L (ref 38–126)
Anion gap: 9 (ref 5–15)
BUN: <5 mg/dL — ABNORMAL LOW (ref 6–20)
CO2: 23 mmol/L (ref 22–32)
Calcium: 8.8 mg/dL — ABNORMAL LOW (ref 8.9–10.3)
Chloride: 107 mmol/L (ref 98–111)
Creatinine, Ser: 0.9 mg/dL (ref 0.61–1.24)
GFR calc Af Amer: >60 mL/min (ref >60)
GFR calc non Af Amer: >60 mL/min (ref >60)
Glucose, Bld: 126 mg/dL — ABNORMAL HIGH (ref 70–99)
Potassium: 4.1 mmol/L (ref 3.5–5.1)
Sodium: 139 mmol/L (ref 135–145)
Total Bilirubin: 0.8 mg/dL (ref 0.3–1.2)
Total Protein: 7 g/dL (ref 6.5–8.1)

## 2019-11-11 LAB — HEMOGLOBIN A1C
Hgb A1c MFr Bld: 4.9 % (ref 4.8–5.6)
Mean Plasma Glucose: 93.93 mg/dL

## 2019-11-11 LAB — LIPID PANEL
Cholesterol: 267 mg/dL — ABNORMAL HIGH (ref 0–200)
HDL: 50 mg/dL (ref >40)
LDL Cholesterol: 174 mg/dL — ABNORMAL HIGH (ref 0–99)
Total CHOL/HDL Ratio: 5.3 RATIO
Triglycerides: 217 mg/dL — ABNORMAL HIGH (ref <150)
VLDL: 43 mg/dL — ABNORMAL HIGH (ref 0–40)

## 2019-11-11 LAB — CBC
HCT: 48.4 % (ref 39.0–52.0)
Hemoglobin: 17 g/dL (ref 13.0–17.0)
MCH: 34.8 pg — ABNORMAL HIGH (ref 26.0–34.0)
MCHC: 35.1 g/dL (ref 30.0–36.0)
MCV: 99 fL (ref 80.0–100.0)
Platelets: 190 10*3/uL (ref 150–400)
RBC: 4.89 MIL/uL (ref 4.22–5.81)
RDW: 11.7 % (ref 11.5–15.5)
WBC: 6.2 10*3/uL (ref 4.0–10.5)
nRBC: 0 % (ref 0.0–0.2)

## 2019-11-11 LAB — ECHOCARDIOGRAM COMPLETE
Height: 76 in
Weight: 3040 oz

## 2019-11-11 LAB — MAGNESIUM: Magnesium: 1.8 mg/dL (ref 1.7–2.4)

## 2019-11-11 LAB — TSH: TSH: 0.963 u[IU]/mL (ref 0.350–4.500)

## 2019-11-11 LAB — SARS CORONAVIRUS 2 (TAT 6-24 HRS): SARS Coronavirus 2: NEGATIVE

## 2019-11-11 LAB — ETHANOL: Alcohol, Ethyl (B): <10 mg/dL (ref <10)

## 2019-11-11 MED ORDER — THIAMINE HCL 100 MG/ML IJ SOLN: 100.0000 mg | Freq: Every day | INTRAMUSCULAR | Status: DC

## 2019-11-11 MED ORDER — LORAZEPAM 2 MG/ML IJ SOLN: 1.0000 mg | INTRAMUSCULAR | Status: DC | PRN

## 2019-11-11 MED ORDER — DILTIAZEM HCL ER COATED BEADS 240 MG PO CP24
240.0000 mg | ORAL_CAPSULE | Freq: Once | ORAL | Status: AC
  Administered 2019-11-11: 240 mg via ORAL
  Filled 2019-11-11: qty 1

## 2019-11-11 MED ORDER — ADULT MULTIVITAMIN W/MINERALS CH
1.0000 | ORAL_TABLET | Freq: Every day | ORAL | Status: DC
  Administered 2019-11-11 – 2019-11-12 (×2): 1 via ORAL
  Filled 2019-11-11 (×2): qty 1

## 2019-11-11 MED ORDER — FOLIC ACID 1 MG PO TABS
1.0000 mg | ORAL_TABLET | Freq: Every day | ORAL | Status: DC
  Administered 2019-11-11 – 2019-11-12 (×2): 1 mg via ORAL
  Filled 2019-11-11 (×2): qty 1

## 2019-11-11 MED ORDER — VITAMIN B-1 100 MG PO TABS
100.0000 mg | ORAL_TABLET | Freq: Every day | ORAL | Status: DC
  Administered 2019-11-12: 100 mg via ORAL
  Filled 2019-11-11: qty 1

## 2019-11-11 MED ORDER — DILTIAZEM HCL-DEXTROSE 125-5 MG/125ML-% IV SOLN (PREMIX)
5.0000 mg/h | INTRAVENOUS | Status: DC
  Administered 2019-11-11: 5 mg/h via INTRAVENOUS
  Filled 2019-11-11 (×2): qty 125

## 2019-11-11 MED ORDER — VITAMIN B-1 100 MG PO TABS
500.0000 mg | ORAL_TABLET | Freq: Once | ORAL | Status: AC
  Administered 2019-11-11: 500 mg via ORAL
  Filled 2019-11-11: qty 5

## 2019-11-11 MED ORDER — ONDANSETRON HCL 4 MG/2ML IJ SOLN: 4.0000 mg | Freq: Four times a day (QID) | INTRAMUSCULAR | Status: DC | PRN

## 2019-11-11 MED ORDER — ACETAMINOPHEN 325 MG PO TABS: 650.0000 mg | ORAL_TABLET | ORAL | Status: DC | PRN

## 2019-11-11 MED ORDER — APIXABAN 5 MG PO TABS
5.0000 mg | ORAL_TABLET | Freq: Two times a day (BID) | ORAL | Status: DC
  Administered 2019-11-11 – 2019-11-12 (×3): 5 mg via ORAL
  Filled 2019-11-11 (×4): qty 1

## 2019-11-11 MED ORDER — LORAZEPAM 1 MG PO TABS: 1.0000 mg | ORAL_TABLET | ORAL | Status: DC | PRN

## 2019-11-11 NOTE — H&P (Addendum)
Chief Complaint:  palpitations  HPI:  This is a 58 y.o. male with a past medical history significant for alcohol use and tobacco use presenting with sudden onset palpitations, weakness, and dizziness. He states this has been happening off and on for the past six months. He denies SOB, chest pain, or nausea. He recently went to urgent care with similar symptoms, which were thought to be due to vertigo as his heart rate was normal. He denies cough, lower extremity swelling, episodes of syncope or almost passing out.  He does not take any medications. He has smoked 1ppd since he was a teenager. He drinks most days and for the past six months has drank 6-12 light beers per day. He has never gone through withdrawal before but states it's been months since he last had a day without drinking.  He is accompanied by his partner who states he snores at night and will frequently start coughing while sleeping.   PMHx:  Past Medical History:  Diagnosis Date  . Medical history non-contributory   . Tobacco abuse 05/24/2014    Past Surgical History:  Procedure Laterality Date  . APPENDECTOMY    . LEFT HEART CATHETERIZATION WITH CORONARY ANGIOGRAM N/A 05/24/2014   Procedure: LEFT HEART CATHETERIZATION WITH CORONARY ANGIOGRAM;  Surgeon: Marykay Lexavid W Harding, MD;  [facility];  Service: Cardiovascular;  Laterality: N/A;    FAMHx:  Family History  Problem Relation Age of Onset  . Stroke Mother   . Hypertension Father     SOCHx:   reports that he has been smoking. He does not have any smokeless tobacco history on file. He reports current alcohol use. He reports that he does not use drugs.  ALLERGIES:  Allergies  Allergen Reactions  . Penicillins Other (See Comments)    unknown    ROS: 10 point ROS negative except as noted in HPI.   HOME MEDS: (Not in a hospital admission) Does not take any medications at home  LABS/IMAGING: Results for orders placed or performed during the hospital  encounter of 11/11/19 (from the past 48 hour(s))  TSH     Status: None   Collection Time: 11/11/19 11:58 AM  Result Value Ref Range   TSH 0.963 0.350 - 4.500 uIU/mL    Comment: Performed by a 3rd Generation assay with a functional sensitivity of <=0.01 uIU/mL. Performed at [facility], [address] [place], [place] [id number]   Comprehensive metabolic panel     Status: Abnormal   Collection Time: 11/11/19 11:58 AM  Result Value Ref Range   Sodium 139 135 - 145 mmol/L   Potassium 4.1 3.5 - 5.1 mmol/L   Chloride 107 98 - 111 mmol/L   CO2 23 22 - 32 mmol/L   Glucose, Bld 126 (H) 70 - 99 mg/dL   BUN <5 (L) 6 - 20 mg/dL   Creatinine, Ser 6.040.90 0.61 - 1.24 mg/dL   Calcium 8.8 (L) 8.9 - 10.3 mg/dL   Total Protein 7.0 6.5 - 8.1 g/dL   Albumin 3.8 3.5 - 5.0 g/dL   AST 36 15 - 41 U/L   ALT 42 0 - 44 U/L   Alkaline Phosphatase 110 38 - 126 U/L   Total Bilirubin 0.8 0.3 - 1.2 mg/dL   GFR calc non Af Amer >60 >60 mL/min   GFR calc Af Amer >60 >60 mL/min   Anion gap 9 5 - 15    Comment: Performed at [facility], [address], [place], [place] [address]  Ethanol     Status: None   Collection Time: 11/11/19 11:58 AM  Result Value Ref Range   Alcohol, Ethyl (B) <10 <10 mg/dL    Comment: (NOTE) Lowest detectable limit for serum alcohol is 10 mg/dL. For medical purposes only. Performed at [facility], [address], [place], [place] [address]   Magnesium     Status: None   Collection Time: 11/11/19 11:58 AM  Result Value Ref Range   Magnesium 1.8 1.7 - 2.4 mg/dL    Comment: Performed at [facility], [address], [place], [place] [address]  CBC     Status: Abnormal   Collection Time: 11/11/19 11:58 AM  Result Value Ref Range   WBC 6.2 4.0 - 10.5 K/uL   RBC 4.89 4.22 - 5.81 MIL/uL   Hemoglobin 17.0 13.0 - 17.0 g/dL   HCT 61.9 50.9 - 32.6 %   MCV 99.0 80.0 - 100.0 fL   MCH 34.8 (H) 26.0 - 34.0 pg   MCHC 35.1 30.0 - 36.0 g/dL   RDW 71.2 45.8 - 09.9 %    Platelets 190 150 - 400 K/uL   nRBC 0.0 0.0 - 0.2 %    Comment: Performed at [facility], [address], [place], [place] [address]   Dg Chest Portable 1 View  Result Date: 11/11/2019 CLINICAL DATA:  58 year old male with palpitation. EXAM: PORTABLE CHEST 1 VIEW COMPARISON:  Chest radiograph dated 05/24/2014. FINDINGS: The heart size and mediastinal contours are within normal limits. Both lungs are clear. The visualized skeletal structures are unremarkable. IMPRESSION: No active disease. Electronically Signed   By: Elgie Collard M.D.   On: 11/11/2019 12:12    VITALS: Blood pressure (!) 143/99, pulse 81, temperature 98.5 F (36.9 C), temperature source Oral, resp. rate 17, height 6\' 4"  (1.93 m), weight 86.2 kg, SpO2 98 %.  EXAM: General appearance: alert, cooperative and appears stated age Neck: no carotid bruit and no JVD Lungs: clear to auscultation bilaterally Heart: irregularly irregular rhythm, no click, no rub and no murmur Abdomen: soft, non-tender; bowel sounds normal; no masses,  no organomegaly Extremities: no LE edema Pulses: 2+ and symmetric Skin: Skin color, texture, turgor normal. No rashes or lesions Neurologic: Grossly normal  IMPRESSION:  58yo male with tobacco use and alcohol use presenting with symptoms of palpitations intermittently for the past six months with new onset atrial flutter with RVR.  PLAN:  Atrial Flutter with RVR Initial HR up to 190, placed on IV dilt gtt in the ED with intermittent improvement in rate although continues to be in the 160s currently. Also flipping in and out of NSR. He is still having mild symptoms of dizziness and anxiety with this. Etiology is likely secondary to his chronic alcohol use, the importance of smoking and drinking cessation was discussed with him. TSH normal. His Chadsvasc score is 1 but will start anticoagulation as we will consider possible cardioversion tomorrow. No current signs of HF and CXR wnl but from  history he may have been having episodes of flutter for the past six months and will evaluate heart function.  - start po dilt 240 mg now - cont. IV dilt and wean as able - will consider adding metop if rate does not improve.  - start apixaban 5 mg bid - risks and benefits discussed with patient  -  Stat ECHO  - A1c, lipid panel  - care management consulted to establish  PCP   OSA History consistent with likely sleep apnea. Will need evaluation at discharge  Alcohol Use Drinks 6-12 beers per day. No history of withdrawal.  - ciwa with ativan  - thiamine - folate   Seawell, Jaimie A, DO 11/11/2019, 1:30 PM Pager: [phone]

## 2019-11-11 NOTE — ED Provider Notes (Addendum)
[facility] EMERGENCY [facility]MENT Provider Note   CSN: [id number] Arrival date & time: 11/11/19  1131     History   Chief Complaint Chief Complaint  Patient presents with  . Atrial Fibrillation    HPI Kelly Gates is a 58 y.o. male.     HPI Patient brought in with dizziness palpitations and shortness of breath.  Seen by PCP for the same.  Has had symptoms for over around 6 months.  Found by EMS to the heart rate around 190.  Had been given adenosine.  Then got a total of 30 of Cardizem with some improvement.  Appears to be in atrial flutter now.  No history of same.  States he has had episodes on and off for he gets the shortness of breath episodes.  Has seen PCP but never had an episode while he was on monitor or had an EKG. he is a heavy drinker.  States drinks 6-12 beers a day.  Drank yesterday.  [facility].  No chest pain with it.  Does feel lightheaded when it comes on and off.  No weight loss. Past Medical History:  Diagnosis Date  . Medical history non-contributory   . Tobacco abuse 05/24/2014    Patient Active Problem List   Diagnosis Date Noted  . Atrial flutter with rapid ventricular response (HCC) 11/11/2019  . Fatigue 12/28/2014  . Chest pain 05/24/2014  . Elevated blood pressure 05/24/2014  . Unstable angina (HCC) 05/24/2014  . HTN (hypertension) 05/24/2014  . Hyperlipidemia 05/24/2014  . Tobacco abuse 05/24/2014    Past Surgical History:  Procedure Laterality Date  . APPENDECTOMY    . LEFT HEART CATHETERIZATION WITH CORONARY ANGIOGRAM N/A 05/24/2014   Procedure: LEFT HEART CATHETERIZATION WITH CORONARY ANGIOGRAM;  Surgeon: Marykay Lexavid W Harding, MD;  [facility];  Service: Cardiovascular;  Laterality: N/A;        Home Medications    Prior to Admission medications   Medication Sig Start Date End Date Taking? Authorizing Provider  aspirin 81 MG chewable tablet Chew 1 tablet (81 mg total) by mouth  daily. Patient not taking: Reported on 12/28/2014 05/24/14   Eber HongMiller, Brian, MD  rosuvastatin (CRESTOR) 10 MG tablet Take 1 tablet (10 mg total) by mouth daily. 12/28/14   Nahser, Deloris PingPhilip J, MD    Family History Family History  Problem Relation Age of Onset  . Stroke Mother   . Hypertension Father     Social History Social History   Tobacco Use  . Smoking status: Current Every Day Smoker  Substance Use Topics  . Alcohol use: Yes  . Drug use: No     Allergies   Penicillins   Review of Systems Review of Systems  Constitutional: Negative for appetite change.  HENT: Negative for congestion.   Respiratory: Positive for shortness of breath.   Cardiovascular: Positive for palpitations.  Gastrointestinal: Negative for abdominal pain.  Genitourinary: Negative for flank pain.  Musculoskeletal: Negative for back pain.  Neurological: Positive for light-headedness. Negative for speech difficulty.  Psychiatric/Behavioral: Negative for confusion.     Physical Exam Updated Vital Signs BP (!) 171/117   Pulse (!) 161   Temp 98.5 F (36.9 C) (Oral)   Resp 19   Ht 6\' 4"  (1.93 m)   Wt 86.2 kg   SpO2 97%   BMI 23.13 kg/m   Physical Exam Vitals signs and nursing note reviewed.  HENT:     Head: Normocephalic.  Eyes:  Pupils: Pupils are equal, round, and reactive to light.  Neck:     Musculoskeletal: Neck supple.  Cardiovascular:     Rate and Rhythm: Tachycardia present. Rhythm irregular.  Pulmonary:     Breath sounds: No wheezing, rhonchi or rales.  Abdominal:     Tenderness: There is no abdominal tenderness.  Musculoskeletal:     Right lower leg: No edema.     Left lower leg: No edema.  Skin:    General: Skin is warm.     Capillary Refill: Capillary refill takes less than 2 seconds.  Neurological:     Mental Status: He is alert. Mental status is at baseline.      ED Treatments / Results  Labs (all labs ordered are listed, but only abnormal results are  displayed) Labs Reviewed  COMPREHENSIVE METABOLIC PANEL - Abnormal; Notable for the following components:      Result Value   Glucose, Bld 126 (*)    BUN <5 (*)    Calcium 8.8 (*)    All other components within normal limits  CBC - Abnormal; Notable for the following components:   MCH 34.8 (*)    All other components within normal limits  SARS CORONAVIRUS 2 (TAT 6-24 HRS)  TSH  ETHANOL  MAGNESIUM  LIPID PANEL  HEMOGLOBIN A1C  HIV ANTIBODY (ROUTINE TESTING W REFLEX)    EKG EKG Interpretation  Date/Time:  Thursday November 11 2019 11:37:48 EST Ventricular Rate:  125 PR Interval:    QRS Duration: 115 QT Interval:  281 QTC Calculation: 406 R Axis:   37 Text Interpretation: Atrial flutter Ventricular premature complex Right bundle branch block Confirmed by Davonna Belling [phone]) on 11/11/2019 12:39:06 PM   Radiology Dg Chest Portable 1 View  Result Date: 11/11/2019 CLINICAL DATA:  58 year old male with palpitation. EXAM: PORTABLE CHEST 1 VIEW COMPARISON:  Chest radiograph dated 05/24/2014. FINDINGS: The heart size and mediastinal contours are within normal limits. Both lungs are clear. The visualized skeletal structures are unremarkable. IMPRESSION: No active disease. Electronically Signed   By: Anner Crete M.D.   On: 11/11/2019 12:12    Procedures Procedures (including critical care time)  Medications Ordered in ED Medications  diltiazem (CARDIZEM) 125 mg in dextrose 5% 125 mL (1 mg/mL) infusion (15 mg/hr Intravenous Rate/Dose Change 11/11/19 1359)  diltiazem (CARDIZEM CD) 24 hr capsule 240 mg (has no administration in time range)  thiamine (VITAMIN B-1) tablet 500 mg (has no administration in time range)  apixaban (ELIQUIS) tablet 5 mg (has no administration in time range)  acetaminophen (TYLENOL) tablet 650 mg (has no administration in time range)  ondansetron (ZOFRAN) injection 4 mg (has no administration in time range)  LORazepam (ATIVAN) tablet 1-4 mg (has  no administration in time range)    Or  LORazepam (ATIVAN) injection 1-4 mg (has no administration in time range)  thiamine (VITAMIN B-1) tablet 100 mg (has no administration in time range)    Or  thiamine (B-1) injection 100 mg (has no administration in time range)  folic acid (FOLVITE) tablet 1 mg (has no administration in time range)  multivitamin with minerals tablet 1 tablet (has no administration in time range)     Initial Impression / Assessment and Plan / ED Course  I have reviewed the triage vital signs and the nursing notes.  Pertinent labs & imaging results that were available during my care of the patient were reviewed by me and considered in my medical decision making (see chart for details).  Patient presents with few months of lightheadedness and feeling his heart race.  Comes and goes.  Found to be in new onset atrial flutter with RVR.  Given IV Cardizem by EMS and by me.  Rate improved but still in atrial flutter.  Since at this point requiring IV Cardizem will admit to cardiology. Chadsvasc of 1 for HTN.  CRITICAL CARE Performed by: Benjiman Core Total critical care time: 30 minutes Critical care time was exclusive of separately billable procedures and treating other patients. Critical care was necessary to treat or prevent imminent or life-threatening deterioration. Critical care was time spent personally by me on the following activities: development of treatment plan with patient and/or surrogate as well as nursing, discussions with consultants, evaluation of patient's response to treatment, examination of patient, obtaining history from patient or surrogate, ordering and performing treatments and interventions, ordering and review of laboratory studies, ordering and review of radiographic studies, pulse oximetry and re-evaluation of patient's condition.  Seen by Dr. Cristal Deer from cardiology.  We will have some times when he goes in and out of the atrial  flutter.  Will give oral Cardizem at 240 and decrease the Cardizem drip to 5 shortly after he gets this.   Final Clinical Impressions(s) / ED Diagnoses   Final diagnoses:  Atrial flutter with rapid ventricular response [facility])    ED Discharge Orders    None       Benjiman Core, MD 11/11/19 1440    Benjiman Core, MD 11/11/19 1444

## 2019-11-11 NOTE — ED Triage Notes (Signed)
Pt having dizziness, palpitations, and shob x 3-4 months. Has been evaluated by PCP for same and was thought to be vertigo until today when his HR was 190 on their monitor. Adenosine given by EMS, then 20 mg cardizem, 10 cardizem, each with temporary improvement but 160 HR on arrival to ED. Cardizem 5 mg/hr started by EMS just PTA. No hx of afib per pt. Pt A/O x 4, NAD.

## 2019-11-11 NOTE — ED Notes (Signed)
Heart healthy dinner tray ordered 

## 2019-11-11 NOTE — Plan of Care (Signed)

## 2019-11-12 DIAGNOSIS — Z720 Tobacco use: Secondary | ICD-10-CM | POA: Diagnosis not present

## 2019-11-12 DIAGNOSIS — F1010 Alcohol abuse, uncomplicated: Secondary | ICD-10-CM

## 2019-11-12 DIAGNOSIS — I10 Essential (primary) hypertension: Secondary | ICD-10-CM | POA: Diagnosis not present

## 2019-11-12 DIAGNOSIS — I4892 Unspecified atrial flutter: Secondary | ICD-10-CM | POA: Diagnosis not present

## 2019-11-12 DIAGNOSIS — E785 Hyperlipidemia, unspecified: Secondary | ICD-10-CM | POA: Diagnosis not present

## 2019-11-12 LAB — COMPREHENSIVE METABOLIC PANEL
ALT: 36 U/L (ref 0–44)
AST: 27 U/L (ref 15–41)
Albumin: 3.5 g/dL (ref 3.5–5.0)
Alkaline Phosphatase: 110 U/L (ref 38–126)
Anion gap: 10 (ref 5–15)
BUN: 11 mg/dL (ref 6–20)
CO2: 24 mmol/L (ref 22–32)
Calcium: 8.8 mg/dL — ABNORMAL LOW (ref 8.9–10.3)
Chloride: 105 mmol/L (ref 98–111)
Creatinine, Ser: 0.85 mg/dL (ref 0.61–1.24)
GFR calc Af Amer: >60 mL/min (ref >60)
GFR calc non Af Amer: >60 mL/min (ref >60)
Glucose, Bld: 104 mg/dL — ABNORMAL HIGH (ref 70–99)
Potassium: 3.8 mmol/L (ref 3.5–5.1)
Sodium: 139 mmol/L (ref 135–145)
Total Bilirubin: 0.7 mg/dL (ref 0.3–1.2)
Total Protein: 6.5 g/dL (ref 6.5–8.1)

## 2019-11-12 LAB — MRSA PCR SCREENING: MRSA by PCR: NEGATIVE

## 2019-11-12 MED ORDER — DILTIAZEM HCL ER COATED BEADS 240 MG PO CP24
240.0000 mg | ORAL_CAPSULE | Freq: Every day | ORAL | Status: DC
  Administered 2019-11-12: 240 mg via ORAL
  Filled 2019-11-12: qty 1

## 2019-11-12 MED ORDER — DILTIAZEM HCL ER COATED BEADS 240 MG PO CP24: 240.0000 mg | ORAL_CAPSULE | Freq: Every day | ORAL | 2 refills | Status: DC

## 2019-11-12 MED ORDER — DILTIAZEM HCL 30 MG PO TABS: 30.0000 mg | ORAL_TABLET | ORAL | 11 refills | Status: DC | PRN

## 2019-11-12 MED ORDER — APIXABAN 5 MG PO TABS: 5.0000 mg | ORAL_TABLET | Freq: Two times a day (BID) | ORAL | 2 refills | Status: DC

## 2019-11-12 NOTE — Discharge Summary (Addendum)
Discharge Summary    Patient ID: Kelly Gates MRN: [id number]; DOB: Feb 01, 1961  Admit date: 11/11/2019 Discharge date: 11/12/2019  Primary Care Provider: Carmon GinsbergPa, [facility]  Primary Cardiologist: Dr. Kristeen MissPhilip Nahser Primary Electrophysiologist:  None   Discharge Diagnoses    Principal Problem:   Atrial flutter with rapid ventricular response (HCC) Active Problems:   HTN (hypertension)   Hyperlipidemia   Tobacco abuse   Alcohol abuse    Diagnostic Studies/Procedures    Echocardiogram 11/11/2019: Impressions:  1. Left ventricular ejection fraction, by visual estimation, is 55 to 60%. The left ventricle has normal function. There is no left ventricular hypertrophy.  2. Left ventricular diastolic function could not be evaluated.  3. The left ventricle has no regional wall motion abnormalities.  4. Global right ventricle has normal systolic function.The right ventricular size is normal. No increase in right ventricular wall thickness.  5. Left atrial size was normal.  6. Right atrial size was normal.  7. Presence of pericardial fat pad.  8. The mitral valve is grossly normal. No evidence of mitral valve regurgitation.  9. The tricuspid valve is grossly normal. Tricuspid valve regurgitation is not demonstrated. 10. The aortic valve is tricuspid. Aortic valve regurgitation is not visualized. No evidence of aortic valve sclerosis or stenosis. 11. The pulmonic valve was grossly normal. Pulmonic valve regurgitation is not visualized. 12. The inferior vena cava is normal in size with greater than 50% respiratory variability, suggesting right atrial pressure of 3 mmHg. _____________   History of Present Illness     Kelly Gates is a 58 y.o. male with a history of alcohol abuse and tobacco abuse who presented to the ED on 11/11/2019 with sudden onset palpitations, weakness, and dizziness. He states this has been happening off and on for the past six months. He denies  shortness of breath, chest pain, or nausea. He recently went to urgent care with similar symptoms, which were thought to be due to vertigo as his heart rate was normal. He denies cough, lower extremity swelling, episodes of syncope, or almost passing out.  He does not take any medications. He has smoked 1 pack-per-day since he was a teenager. He drinks most days and for the past six months has drank 6-12 light beers per day. He has never gone through withdrawal before but states it's been months since he last had a day without drinking. He is accompanied by his partner who states he snores at night and will frequently start coughing while sleeping.    In the ED, he was noted to be going in and out of atrial fibrillation with RVR. He was started on Cardizem drip and Eliquis and was admitted overnight for observation given intermittent heart rates in the 160's.  Hospital Course     Consultants: None  Atrial Flutter with RVR Echo showed LVEF of 55-60% with no wall motion abnormalities or significant valvular disease. TSH and electrolytes normal. Overnight, patient continued to go in and out of atrial flutter with rates as high as the 140's. However, currently in normal sinus rhythm with rates in the 60's.  Patient reports occasional fluttering sensation but otherwise denies any cardiac symptoms. Will transition to PO Cardizem 240mg  daily and. CHA2DS2-VASc =1 (HTN). Will continue Eliquis 5mg  twice daily. Will set up outpatient follow-up in [facility] to discuss possible atrial flutter ablation. Will also arrange follow-up with general Cardiology team.  Hypertension BP as high as 171/117 during admission but this is while he was in atrial  flutter with rates in the 160's. Most recent improved but still mildly elevated in the 130's/90's. Will start on Cardizem 240mg  daily. Could consider adding beta-blocker as outpatient if needed.  Hyperlipidemia Lipid panel this admission: Total Cholesterol 267,  Triglycerides 217, HDL 50, LDL 174. Will plan to start statin as outpatient if patient is willing (Patient not on any medications prior to admission so we don't want to add to many medications at once - more important that patient compliant with Cardizem and Eliquis).  Alcohol Abuse He was started on CIWA protocol during admission but showed no withdrawal symptoms. No history of withdrawal. Patient educated on the importance of cessation and how this is likely contributing to atrial flutter. Patient voiced understand and agreed to quitting. Will include information about alcohol dependence and withdrawal in discharge paperwork.  Tobacco Abuse Dr. Harrell Gave counseled patient on importance of tobacco cessation yesterday and it was discussed again today. Patient voiced understanding and knows he needs to quit. Will include more information in discharge paperwork.  Possible Sleep Apnea History consistent with likely sleep apnea. Recommend outpatient sleep study.  Patient seen and examined by Dr. Harrell Gave today and determined to be stable for discharge. Outpatient follow-ups arranged. Medications as below.  Did the patient have an acute coronary syndrome (MI, NSTEMI, STEMI, etc) this admission?:  No                               Did the patient have a percutaneous coronary intervention (stent / angioplasty)?:  No.   _____________  Discharge Vitals Blood pressure (!) 137/91, pulse 84, temperature 98.7 F (37.1 C), temperature source Oral, resp. rate 18, height 6\' 4"  (1.93 m), weight 82.6 kg, SpO2 96 %.   General: 58 y.o. male resting comfortably in no acute distress. HEENT: Normocephalic and atraumatic. Sclera clear. EOMs intact. Neck: Supple. No JVD. Heart: RRR. Distinct S1 and S2. No murmurs, gallops, or rubs.  Radial pulses 2+ and equal bilaterally. Lungs: No increased work of breathing. Clear to ausculation bilaterally. No wheezes, rhonchi, or rales.  Abdomen: Soft, non-distended, and  non-tender to palpation. Bowel sounds present. MSK: Normal strength and tone for age. Extremities: No lower extremity edema.    Skin: Warm and dry. Neuro: Alert and oriented x3. No focal deficits. Psych: Normal affect. Responds appropriately.  Telemetry: Personally reviewed and demonstrates patient going in and out of atrial flutter overnight with rates as high as the 140's. Currently, in normal sinus rhythm with rates in the 60's to 90's. There was a 2.18 second post conversion pause noted.  Filed Weights   11/11/19 1136 11/11/19 1910 11/12/19 0333  Weight: 86.2 kg 83 kg 82.6 kg    Labs & Radiologic Studies    CBC Recent Labs    11/11/19 1158  WBC 6.2  HGB 17.0  HCT 48.4  MCV 99.0  PLT 427   Basic Metabolic Panel Recent Labs    11/11/19 1158 11/12/19 0225  NA 139 139  K 4.1 3.8  CL 107 105  CO2 23 24  GLUCOSE 126* 104*  BUN <5* 11  CREATININE 0.90 0.85  CALCIUM 8.8* 8.8*  MG 1.8  --    Liver Function Tests Recent Labs    11/11/19 1158 11/12/19 0225  AST 36 27  ALT 42 36  ALKPHOS 110 110  BILITOT 0.8 0.7  PROT 7.0 6.5  ALBUMIN 3.8 3.5   No results for input(s): LIPASE, AMYLASE in  the last 72 hours. High Sensitivity Troponin:   No results for input(s): TROPONINIHS in the last 720 hours.  BNP Invalid input(s): POCBNP D-Dimer No results for input(s): DDIMER in the last 72 hours. Hemoglobin A1C Recent Labs    11/11/19 1158  HGBA1C 4.9   Fasting Lipid Panel Recent Labs    11/11/19 1158  CHOL 267*  HDL 50  LDLCALC 174*  TRIG 217*  CHOLHDL 5.3   Thyroid Function Tests Recent Labs    11/11/19 1158  TSH 0.963   _____________  Dg Chest Portable 1 View  Result Date: 11/11/2019 CLINICAL DATA:  58 year old male with palpitation. EXAM: PORTABLE CHEST 1 VIEW COMPARISON:  Chest radiograph dated 05/24/2014. FINDINGS: The heart size and mediastinal contours are within normal limits. Both lungs are clear. The visualized skeletal structures are  unremarkable. IMPRESSION: No active disease. Electronically Signed   By: Elgie Collard M.D.   On: 11/11/2019 12:12    Disposition   Patient is being discharged home today in good condition.  Follow-up Plans & Appointments    Follow-up Information     Newman Nip, NP Follow up.   Specialties: Nurse Practitioner, Cardiology Why: You have a follow-up visit on 11/22/2019 at 10:00am with Rudi Coco, one of our NPs in our Atrial Fibrillation [facility]. If this date/time does not work for you, please call our office to reschedule. Contact information: [address] [place] [address] [phone]         Tereso Newcomer T, PA-C Follow up.   Specialties: Cardiology, Physician Assistant Why: You have a follow-up visit scheduled for 12/07/2019 at 11:45am with Tereso Newcomer, one of our PAs in our [facility] office. If this date/time does not work for you, please call our office to reschedule. Contact information: [address] [place] [address] [phone]              Discharge Medications   Allergies as of 11/12/2019       Reactions   Penicillins Other (See Comments)   Did it involve swelling of the face/tongue/throat, SOB, or low BP? N/A Did it involve sudden or severe rash/hives, skin peeling, or any reaction on the inside of your mouth or nose? N/A Did you need to seek medical attention at a hospital or doctor's office? N/A When did it last happen? Childhood      If all above answers are "NO", may proceed with cephalosporin use.        Medication List     TAKE these medications    apixaban 5 MG Tabs tablet Commonly known as: ELIQUIS Take 1 tablet (5 mg total) by mouth 2 (two) times daily.   diltiazem 240 MG 24 hr capsule Commonly known as: CARDIZEM CD Take 1 capsule (240 mg total) by mouth daily.   diltiazem 30 MG tablet Commonly known as: Cardizem Take 1 tablet (30 mg total) by mouth every 4 (four) hours as needed (atrial  flutter, no more than 4 extra doses/day).           Outstanding Labs/Studies   N/A  Duration of Discharge Encounter   Greater than 30 minutes including physician time.  Signed, Jodelle Red, MD 11/12/2019, 9:30 AM

## 2019-11-12 NOTE — TOC Transition Note (Addendum)
Transition of Care [facility]) - CM/SW Discharge Note   Patient Details  Name: Kelly Gates MRN: [id number] Date of Birth: [birth year]  [facility] CM/SW Contact:  Zenon Mayo, RN Phone Number: 11/12/2019, 10:19 AM   Clinical Narrative:    Patient is for dc today, his wife will call and make follow up apt at the [facility] where he goes for his PCP.  NCM gave patient the 30 day free eliquis coupon and the 10.00 eliqis coupon.  Per the pharmacist cost is 498.00, but will run the coupons when he brings them in.     Final next level of care: Home/Self Care Barriers to Discharge: No Barriers Identified   Patient Goals and CMS Choice Patient states their goals for this hospitalization and ongoing recovery are:: go home   Choice offered to / list presented to : NA  Discharge Placement                       Discharge Plan and Services                DME Arranged: (NA)         HH Arranged: NA          Social Determinants of Health (SDOH) Interventions     Readmission Risk Interventions No flowsheet data found.

## 2019-11-12 NOTE — Discharge Instructions (Signed)

## 2019-11-12 NOTE — Progress Notes (Signed)
Discharge instructions given. Patient verbalized understanding and all questions were answered.  ?

## 2019-11-12 NOTE — Progress Notes (Signed)
Patient has been in and out of AFib in the past hour. Rates in the 70's. BP stable. No c/o CP. Cardiology paged.

## 2019-11-16 ENCOUNTER — Telehealth (HOSPITAL_COMMUNITY): Payer: Self-pay | Admitting: *Deleted

## 2019-11-16 NOTE — Telephone Encounter (Signed)
Patient called in stating he has an area of "puffiness" to the right groin area between his lower abdomen and groin. Denies bruising, injury, pain or recent procedure to the area. Unsure of cause but was concerned maybe related to his new medications of Eliquis or Cardizem. Pt does have history of hernia in the remote past. Discussed with [name] NP - recommends follow up with PCP regarding above. Pt will call if bruising occurs.

## 2019-11-22 ENCOUNTER — Ambulatory Visit (HOSPITAL_COMMUNITY): Payer: Managed Care, Other (non HMO) | Admitting: Nurse Practitioner

## 2019-11-24 ENCOUNTER — Other Ambulatory Visit: Payer: Self-pay

## 2019-11-24 ENCOUNTER — Encounter (HOSPITAL_COMMUNITY): Payer: Self-pay | Admitting: Nurse Practitioner

## 2019-11-24 ENCOUNTER — Ambulatory Visit (HOSPITAL_COMMUNITY)
Admission: RE | Admit: 2019-11-24 | Discharge: 2019-11-24 | Disposition: A | Discharge status: Home / Self Care | Payer: 59 | Source: Ambulatory Visit | Attending: Nurse Practitioner | Admitting: Nurse Practitioner

## 2019-11-24 VITALS — BP 114/70 | HR 79 | Ht 76.0 in | Wt 193.4 lb

## 2019-11-24 DIAGNOSIS — Z79899 Other long term (current) drug therapy: Secondary | ICD-10-CM | POA: Diagnosis not present

## 2019-11-24 DIAGNOSIS — Z8249 Family history of ischemic heart disease and other diseases of the circulatory system: Secondary | ICD-10-CM | POA: Insufficient documentation

## 2019-11-24 DIAGNOSIS — Z880 Allergy status to penicillin: Secondary | ICD-10-CM | POA: Insufficient documentation

## 2019-11-24 DIAGNOSIS — Z87891 Personal history of nicotine dependence: Secondary | ICD-10-CM | POA: Diagnosis not present

## 2019-11-24 DIAGNOSIS — I480 Paroxysmal atrial fibrillation: Secondary | ICD-10-CM | POA: Diagnosis not present

## 2019-11-24 DIAGNOSIS — Z7901 Long term (current) use of anticoagulants: Secondary | ICD-10-CM | POA: Diagnosis not present

## 2019-11-24 DIAGNOSIS — D6869 Other thrombophilia: Secondary | ICD-10-CM | POA: Diagnosis not present

## 2019-11-24 MED ORDER — DILTIAZEM HCL ER COATED BEADS 120 MG PO CP24: 120.0000 mg | ORAL_CAPSULE | Freq: Every day | ORAL | 3 refills | Status: DC

## 2019-11-24 NOTE — Patient Instructions (Signed)
Decrease Cardizem to 120mg  once daily.

## 2019-11-24 NOTE — Progress Notes (Signed)
Primary Care Physician: [facility] Referring Physician: [facility] f/u    Kelly Gates is a 58 y.o. male with a h/o alcohol abuse and tobacco abuse who presented to the ED on 11/11/2019 with sudden onset palpitations, weakness, and dizziness. He states this has been happening off and on for the past six months.   He does not take any medications. He has smoked 1 pack-per-day since he was a teenager. He drinks most days and for the past six months has drank 6-12 light beers per day  He was noted to be in and out of afib with RVR in the ER. He was started on cardizem drip. Eliquis 5 mg  was started for CHA2DS2VASc score of 1. Echo showed LVEF of 55-60% and no significant valvular abnormalities.  He was discharged on eliquis and cardizem 240 mg daily. It was mentioned to f/u here for possible atrial flutter ablation. The only EKG that is in Epic reviewed with Dr. Johney Frame  and he felt it  looked more like afib, not typical atrial flutter.   Pt now states that he stopped drinking and smoking cold [place]. He is feeling lightheaded from  the daily cardizem. 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 He not had any further rapid rhythm.     Today, he denies symptoms of palpitations, chest pain, shortness of breath, orthopnea, PND, lower extremity edema, dizziness, presyncope, syncope, or neurologic sequela. The patient is tolerating medications without difficulties and is otherwise without complaint today.   Past Medical History:  Diagnosis Date  . Medical history non-contributory   . Tobacco abuse 05/24/2014   Past Surgical History:  Procedure Laterality Date  . APPENDECTOMY    . LEFT HEART CATHETERIZATION WITH CORONARY ANGIOGRAM N/A 05/24/2014   Procedure: LEFT HEART CATHETERIZATION WITH CORONARY ANGIOGRAM;  Surgeon: Marykay Lex, MD;  Location: [facility] CATH LAB;  Service: Cardiovascular;  Laterality: N/A;    Current Outpatient Medications  Medication Sig Dispense  Refill  . apixaban (ELIQUIS) 5 MG TABS tablet Take 1 tablet (5 mg total) by mouth 2 (two) times daily. 60 tablet 2  . diltiazem (CARDIZEM CD) 120 MG 24 hr capsule Take 1 capsule (120 mg total) by mouth daily. 30 capsule 3  . diltiazem (CARDIZEM) 30 MG tablet Take 1 tablet (30 mg total) by mouth every 4 (four) hours as needed (atrial flutter, no more than 4 extra doses/day). 90 tablet 11  . meclizine (ANTIVERT) 25 MG tablet Take 25 mg by mouth as needed.      No current facility-administered medications for this encounter.     Allergies  Allergen Reactions  . Penicillins Other (See Comments)    Did it involve swelling of the face/tongue/throat, SOB, or low BP? N/A Did it involve sudden or severe rash/hives, skin peeling, or any reaction on the inside of your mouth or nose? N/A Did you need to seek medical attention at a hospital or doctor's office? N/A When did it last happen?Childhood If all above answers are "NO", may proceed with cephalosporin use.    Social History   Socioeconomic History  . Marital status: Single    Spouse name: Not on file  . Number of children: Not on file  . Years of education: Not on file  . Highest education level: Not on file  Occupational History  . Not on file  Social Needs  . Financial resource strain: Not on file  . Food insecurity  Worry: Not on file    Inability: Not on file  . Transportation needs    Medical: Not on file    Non-medical: Not on file  Tobacco Use  . Smoking status: Current Every Day Smoker  Substance and Sexual Activity  . Alcohol use: Yes  . Drug use: No  . Sexual activity: Not on file  Lifestyle  . Physical activity    Days per week: Not on file    Minutes per session: Not on file  . Stress: Not on file  Relationships  . Social Herbalist on phone: Not on file    Gets together: Not on file    Attends religious service: Not on file    Active member of club or organization: Not on file    Attends  meetings of clubs or organizations: Not on file    Relationship status: Not on file  . Intimate partner violence    Fear of current or ex partner: Not on file    Emotionally abused: Not on file    Physically abused: Not on file    Forced sexual activity: Not on file  Other Topics Concern  . Not on file  Social History Narrative  . Not on file    Family History  Problem Relation Age of Onset  . Stroke Mother   . Hypertension Father     ROS- All systems are reviewed and negative except as per the HPI above  Physical Exam: Vitals:   11/24/19 0944  BP: 114/70  Pulse: 79  Weight: 87.7 kg  Height: 6\' 4"  (1.93 m)   Wt Readings from Last 3 Encounters:  11/24/19 87.7 kg  11/12/19 82.6 kg  12/28/14 91.5 kg    Labs: Lab Results  Component Value Date   NA 139 11/12/2019   K 3.8 11/12/2019   CL 105 11/12/2019   CO2 24 11/12/2019   GLUCOSE 104 (H) 11/12/2019   BUN 11 11/12/2019   CREATININE 0.85 11/12/2019   CALCIUM 8.8 (L) 11/12/2019   MG 1.8 11/11/2019   No results found for: INR Lab Results  Component Value Date   CHOL 267 (H) 11/11/2019   HDL 50 11/11/2019   LDLCALC 174 (H) 11/11/2019   TRIG 217 (H) 11/11/2019     GEN- The patient is well appearing, alert and oriented x 3 today.   Head- normocephalic, atraumatic Eyes-  Sclera clear, conjunctiva pink Ears- hearing intact Oropharynx- clear Neck- supple, no JVP Lymph- no cervical lymphadenopathy Lungs- Clear to ausculation bilaterally, normal work of breathing Heart- Regular rate and rhythm, no murmurs, rubs or gallops, PMI not laterally displaced GI- soft, NT, ND, + BS Extremities- no clubbing, cyanosis, or edema MS- no significant deformity or atrophy Skin- no rash or lesion Psych- euthymic mood, full affect Neuro- strength and sensation are intact  EKG- NSR at 79 bpm, pr int 140 ms, qrs int 72 ms, qtc 428 ms Dr.Allred  reviewed EKG from  [facility] thought it appeared to be more afib, not typical  a flutter  Epic records reviewed  Echo-IMPRESSIONS    1. Left ventricular ejection fraction, by visual estimation, is 55 to 60%. The left ventricle has normal function. There is no left ventricular hypertrophy.  2. Left ventricular diastolic function could not be evaluated.  3. The left ventricle has no regional wall motion abnormalities.  4. Global right ventricle has normal systolic function.The right ventricular size is normal. No increase in right ventricular wall thickness.  5. Left atrial size was normal.  6. Right atrial size was normal.  7. Presence of pericardial fat pad.  8. The mitral valve is grossly normal. No evidence of mitral valve regurgitation.  9. The tricuspid valve is grossly normal. Tricuspid valve regurgitation is not demonstrated. 10. The aortic valve is tricuspid. Aortic valve regurgitation is not visualized. No evidence of aortic valve sclerosis or stenosis. 11. The pulmonic valve was grossly normal. Pulmonic valve regurgitation is not visualized. 12. The inferior vena cava is normal in size with greater than 50% respiratory variability, suggesting right atrial pressure of 3 mmHg.   Assessment and Plan: 1. 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 CHA2DS2VASc of 1(HTN) On f/u  with Tereso NewcomerScott Weaver, 12/8 can probably stop anticoagulation,  eliquis 5 mg bid, if arrhythmia quiet  At this point  it does not appear he will be having an aflutter  ablation Bleeding precautions discussed   3. Lifestlye issues  Has stopped all alcohol and tobacco  use without any withdrawal symptoms Congratulated on his efforts    F/u with Tereso NewcomerScott Weaver, PA 12/07/19   Elvina Sidleonna C. Matthew Folksarroll, ANP-C [facility] L[facility] [place] [address] [place], [place]  [id number] [phone]

## 2019-12-07 ENCOUNTER — Ambulatory Visit: Payer: 59 | Admitting: Physician Assistant

## 2019-12-07 ENCOUNTER — Other Ambulatory Visit: Payer: Self-pay

## 2019-12-07 ENCOUNTER — Encounter: Payer: Self-pay | Admitting: Physician Assistant

## 2019-12-07 VITALS — BP 134/84 | HR 88 | Ht 76.0 in | Wt 197.4 lb

## 2019-12-07 DIAGNOSIS — I480 Paroxysmal atrial fibrillation: Secondary | ICD-10-CM | POA: Diagnosis not present

## 2019-12-07 DIAGNOSIS — F1010 Alcohol abuse, uncomplicated: Secondary | ICD-10-CM

## 2019-12-07 DIAGNOSIS — I10 Essential (primary) hypertension: Secondary | ICD-10-CM | POA: Diagnosis not present

## 2019-12-07 DIAGNOSIS — R0683 Snoring: Secondary | ICD-10-CM

## 2019-12-07 DIAGNOSIS — Z720 Tobacco use: Secondary | ICD-10-CM

## 2019-12-07 NOTE — Patient Instructions (Addendum)
Medication Instructions:   STOP TAKING :    1. ELIQUIS ON 12-10-19  2. CARDIZEM 120 MG    *If you need a refill on your cardiac medications before your next appointment, please call your pharmacy*  Lab Work: NONE ORDERED  TODAY   If you have labs (blood work) drawn today and your tests are completely normal, you will receive your results only by: Marland Kitchen MyChart Message (if you have MyChart) OR . A paper copy in the mail If you have any lab test that is abnormal or we need to change your treatment, we will call you to review the results.  Testing/Procedures:  NONE ORDERED  TODAY   Follow-Up: At [facility], you and your health needs are our priority.  As part of our continuing mission to provide you with exceptional heart care, we have created designated Provider Care Teams.  These Care Teams include your primary Cardiologist (physician) and Advanced Practice Providers (APPs -  Physician Assistants and Nurse Practitioners) who all work together to provide you with the care you need, when you need it.  Your next appointment:   3 month(s)  The format for your next appointment:     Provider:   Richardson Dopp PA-C   Other Instructions  TAKE BLOOD PRESSURE FOR 2 WEEKS AND CALL CLINIC  WITH READINGS    Low-Sodium Eating Plan Sodium, which is an element that makes up salt, helps you maintain a healthy balance of fluids in your body. Too much sodium can increase your blood pressure and cause fluid and waste to be held in your body. Your health care provider or dietitian may recommend following this plan if you have high blood pressure (hypertension), kidney disease, liver disease, or heart failure. Eating less sodium can help lower your blood pressure, reduce swelling, and protect your heart, liver, and kidneys. What are tips for following this plan? General guidelines  Most people on this plan should limit their sodium intake to 1,500-2,000 mg (milligrams) of sodium each day. Reading  food labels   The Nutrition Facts label lists the amount of sodium in one serving of the food. If you eat more than one serving, you must multiply the listed amount of sodium by the number of servings.  Choose foods with less than 140 mg of sodium per serving.  Avoid foods with 300 mg of sodium or more per serving. Shopping  Look for lower-sodium products, often labeled as "low-sodium" or "no salt added."  Always check the sodium content even if foods are labeled as "unsalted" or "no salt added".  Buy fresh foods. ? Avoid canned foods and premade or frozen meals. ? Avoid canned, cured, or processed meats  Buy breads that have less than 80 mg of sodium per slice. Cooking  Eat more home-cooked food and less restaurant, buffet, and fast food.  Avoid adding salt when cooking. Use salt-free seasonings or herbs instead of table salt or sea salt. Check with your health care provider or pharmacist before using salt substitutes.  Cook with plant-based oils, such as canola, sunflower, or olive oil. Meal planning  When eating at a restaurant, ask that your food be prepared with less salt or no salt, if possible.  Avoid foods that contain MSG (monosodium glutamate). MSG is sometimes added to [place] food, bouillon, and some canned foods. What foods are recommended? The items listed may not be a complete list. Talk with your dietitian about what dietary choices are best for you. Grains Low-sodium cereals, including  oats, puffed wheat and rice, and shredded wheat. Low-sodium crackers. Unsalted rice. Unsalted pasta. Low-sodium bread. Whole-grain breads and whole-grain pasta. Vegetables Fresh or frozen vegetables. "No salt added" canned vegetables. "No salt added" tomato sauce and paste. Low-sodium or reduced-sodium tomato and vegetable juice. Fruits Fresh, frozen, or canned fruit. Fruit juice. Meats and other protein foods Fresh or frozen (no salt added) meat, poultry, seafood, and fish.  Low-sodium canned tuna and salmon. Unsalted nuts. Dried peas, beans, and lentils without added salt. Unsalted canned beans. Eggs. Unsalted nut butters. Dairy Milk. Soy milk. Cheese that is naturally low in sodium, such as ricotta cheese, fresh mozzarella, or Swiss cheese Low-sodium or reduced-sodium cheese. Cream cheese. Yogurt. Fats and oils Unsalted butter. Unsalted margarine with no trans fat. Vegetable oils such as canola or olive oils. Seasonings and other foods Fresh and dried herbs and spices. Salt-free seasonings. Low-sodium mustard and ketchup. Sodium-free salad dressing. Sodium-free light mayonnaise. Fresh or refrigerated horseradish. Lemon juice. Vinegar. Homemade, reduced-sodium, or low-sodium soups. Unsalted popcorn and pretzels. Low-salt or salt-free chips. What foods are not recommended? The items listed may not be a complete list. Talk with your dietitian about what dietary choices are best for you. Grains Instant hot cereals. Bread stuffing, pancake, and biscuit mixes. Croutons. Seasoned rice or pasta mixes. Noodle soup cups. Boxed or frozen macaroni and cheese. Regular salted crackers. Self-rising flour. Vegetables Sauerkraut, pickled vegetables, and relishes. Olives. [place] fries. Onion rings. Regular canned vegetables (not low-sodium or reduced-sodium). Regular canned tomato sauce and paste (not low-sodium or reduced-sodium). Regular tomato and vegetable juice (not low-sodium or reduced-sodium). Frozen vegetables in sauces. Meats and other protein foods Meat or fish that is salted, canned, smoked, spiced, or pickled. Bacon, ham, sausage, hotdogs, corned beef, chipped beef, packaged lunch meats, salt pork, jerky, pickled herring, anchovies, regular canned tuna, sardines, salted nuts. Dairy Processed cheese and cheese spreads. Cheese curds. Blue cheese. Feta cheese. String cheese. Regular cottage cheese. Buttermilk. Canned milk. Fats and oils Salted butter. Regular margarine.  Ghee. Bacon fat. Seasonings and other foods Onion salt, garlic salt, seasoned salt, table salt, and sea salt. Canned and packaged gravies. Worcestershire sauce. Tartar sauce. Barbecue sauce. Teriyaki sauce. Soy sauce, including reduced-sodium. Steak sauce. Fish sauce. Oyster sauce. Cocktail sauce. Horseradish that you find on the shelf. Regular ketchup and mustard. Meat flavorings and tenderizers. Bouillon cubes. Hot sauce and Tabasco sauce. Premade or packaged marinades. Premade or packaged taco seasonings. Relishes. Regular salad dressings. Salsa. Potato and tortilla chips. Corn chips and puffs. Salted popcorn and pretzels. Canned or dried soups. Pizza. Frozen entrees and pot pies. Summary  Eating less sodium can help lower your blood pressure, reduce swelling, and protect your heart, liver, and kidneys.  Most people on this plan should limit their sodium intake to 1,500-2,000 mg (milligrams) of sodium each day.  Canned, boxed, and frozen foods are high in sodium. Restaurant foods, fast foods, and pizza are also very high in sodium. You also get sodium by adding salt to food.  Try to cook at home, eat more fresh fruits and vegetables, and eat less fast food, canned, processed, or prepared foods. This information is not intended to replace advice given to you by your health care provider. Make sure you discuss any questions you have with your health care provider. Document Released: 06/07/2002 Document Revised: 11/28/2017 Document Reviewed: 12/09/2016 Elsevier Patient Education  2020 [employer].

## 2019-12-07 NOTE — Progress Notes (Signed)
Cardiology Office Note:    Date:  12/07/2019   ID:  Kelly Gates, DOB 01-09-1961, MRN [id number]  PCP:  [facility]  Cardiologist:  Kristeen Miss, MD  Electrophysiologist:  None   Referring MD: [facility]*   Chief Complaint  Patient presents with  . Hospitalization Follow-up    Parox AFib    History of Present Illness:    Kelly Gates is a 58 y.o. male with:   Paroxysmal AFib  CHA2DS2-VASc=1 (HTN)  amdx 10/2019 AF w RVR (?AFlutter) >> FU in AF clinic in 10/2019 (no AFlutter >> indication for ablation)   Hypertension   Hyperlipidemia   Tobacco use  Alcohol abuse  Mr. Schauf was last seen by Dr. Elease Hashimoto in 11/2014.  He was admitted 11/12-11/13 AFlutter with RVR.  He was in and out of AFlutter throughout his admission and was in normal sinus rhythm at DC.  He had symptoms c/w OSA and OP sleep study was recommended.  CHA2DS2-VASc=1 (HTN).  However, he was started on Apixaban in anticipation of possible AFlutter ablation in the future.  He was seen in follow up with Rudi Coco, NP in the AFib Clinic on 11/24/2019.  ECGs were reviewed with Dr. Johney Frame who felt that his rhythm was atrial fibrillation and not typical AFlutter.  Therefore, ablation was not recommended.  it was felt he could likely stop anticoagulation at some point due to low risk profile.      He returns for follow-up.  He is here alone.  He has not had any further palpitations.  He actually took himself off of Cardizem CD 120 mg several days ago.  He has actually felt better since then.  This medication was making him feel fatigued.  He has not had chest discomfort, shortness of breath, orthopnea, leg swelling.  He has completely stopped drinking alcohol.  He is also decrease the amount that he is smoking.   Prior CV studies:   The following studies were reviewed today:  Echocardiogram 11/11/2019 EF 55-60, normal wall motion, LA/RA normal size  Cardiac catheterization  05/24/2014 Normal coronary arteries EF 65-70  Coronary CTA 08/15/2006 IMPRESSION:  Normal study demonstrating no evidence of obstructive coronary disease.  No anomalous anatomy is identified.  Normal left ventricular ejection fraction.    Calcium score 0  Past Medical History:  Diagnosis Date  . Medical history non-contributory   . Tobacco abuse 05/24/2014   Surgical Hx: The patient  has a past surgical history that includes Appendectomy and left heart catheterization with coronary angiogram (N/A, 05/24/2014).   Current Medications: Current Meds  Medication Sig  . diltiazem (CARDIZEM) 30 MG tablet Take 1 tablet (30 mg total) by mouth every 4 (four) hours as needed (atrial flutter, no more than 4 extra doses/day).  . [DISCONTINUED] apixaban (ELIQUIS) 5 MG TABS tablet Take 1 tablet (5 mg total) by mouth 2 (two) times daily.  . [DISCONTINUED] diltiazem (CARDIZEM CD) 120 MG 24 hr capsule Take 1 capsule (120 mg total) by mouth daily.     Allergies:   Penicillins   Social History   Tobacco Use  . Smoking status: Current Every Day Smoker  . Smokeless tobacco: Current User  Substance Use Topics  . Alcohol use: Yes  . Drug use: No     Family Hx: The patient's family history includes Hypertension in his father; Stroke in his mother.  ROS:   Please see the history of present illness.    ROS All other systems reviewed  and are negative.   EKGs/Labs/Other Test Reviewed:    EKG:  EKG is not ordered today.  The ekg ordered today demonstrates N/A  Recent Labs: 11/11/2019: Hemoglobin 17.0; Magnesium 1.8; Platelets 190; TSH 0.963 11/12/2019: ALT 36; BUN 11; Creatinine, Ser 0.85; Potassium 3.8; Sodium 139   Recent Lipid Panel Lab Results  Component Value Date/Time   CHOL 267 (H) 11/11/2019 11:58 AM   TRIG 217 (H) 11/11/2019 11:58 AM   HDL 50 11/11/2019 11:58 AM   CHOLHDL 5.3 11/11/2019 11:58 AM   LDLCALC 174 (H) 11/11/2019 11:58 AM    Physical Exam:    VS:  BP 134/84   Pulse  88   Ht 6\' 4"  (1.93 m)   Wt 197 lb 6.4 oz (89.5 kg)   SpO2 100%   BMI 24.03 kg/m     Wt Readings from Last 3 Encounters:  12/07/19 197 lb 6.4 oz (89.5 kg)  11/24/19 193 lb 6.4 oz (87.7 kg)  11/12/19 182 lb 1.6 oz (82.6 kg)     Physical Exam  Constitutional: He is oriented to person, place, and time. He appears well-developed and well-nourished. No distress.  HENT:  Head: Normocephalic and atraumatic.  Eyes: No scleral icterus.  Neck: No JVD present. No thyromegaly present.  Cardiovascular: Normal rate, regular rhythm and normal heart sounds.  No murmur heard. Pulmonary/Chest: Effort normal and breath sounds normal. He has no rales.  Abdominal: Soft. There is no hepatomegaly.  Musculoskeletal:        General: No edema.  Lymphadenopathy:    He has no cervical adenopathy.  Neurological: He is alert and oriented to person, place, and time.  Skin: Skin is warm and dry.  Psychiatric: He has a normal mood and affect.    ASSESSMENT & PLAN:    1. Paroxysmal A-fib (HCC) CHA2DS2-VASc score is 1.  Therefore, he does not require long-term anticoagulation.  He has been maintaining normal sinus rhythm.  He has felt much better since coming off of Cardizem CD.  He has also felt much better since stopping drinking.  I suspect his episode of atrial fibrillation was related to excessive alcohol intake.  I advised him to stop Apixaban.  He can also remain off Cardizem CD.  He can take short acting diltiazem as needed for recurrent palpitations.  Follow-up in 3 months.  2. Essential hypertension Fair control.  He is off of Cardizem CD.  I have asked him to limit his salt, stop smoking, remain off of alcohol.  I have asked him to monitor his blood pressure over the next several weeks and send me those readings for review.  If his blood pressure is above target, consider adding an ARB for better blood pressure control.  3. Tobacco abuse He is trying to quit.  4. Alcohol abuse He has completely  quit drinking alcohol.  We discussed the importance of this going forward to reduce the risk of recurrent atrial fibrillation.  5. Snoring His snoring has essentially resolved since stopping drinking.  He had normal bilateral atria on his echocardiogram.  At this point, I am not convinced that he needs a sleep study.  I have asked him to have his wife continue to monitor his sleep.  We discussed the symptoms of sleep apnea.  If he develops these, he knows to call us that we can arrange sleep study.   Dispo:  Return in about 3 months (around 03/06/2020) for Routine Follow Up, w/ Richardson Dopp, PA-C.   Medication Adjustments/Labs and  Tests Ordered: Current medicines are reviewed at length with the patient today.  Concerns regarding medicines are outlined above.  Tests Ordered: No orders of the defined types were placed in this encounter.  Medication Changes: No orders of the defined types were placed in this encounter.   Signed, Tereso NewcomerScott Cataldo Cosgriff, PA-C  12/07/2019 5:08 PM    [facility] [address], [place], [place]  [id number] Phone: [phone]; Fax: [phone]

## 2019-12-16 ENCOUNTER — Telehealth: Payer: Self-pay | Admitting: Cardiovascular Disease

## 2019-12-16 DIAGNOSIS — I10 Essential (primary) hypertension: Secondary | ICD-10-CM

## 2019-12-16 MED ORDER — LOSARTAN POTASSIUM 25 MG PO TABS: 25.0000 mg | ORAL_TABLET | Freq: Every day | ORAL | 3 refills | Status: DC

## 2019-12-16 NOTE — Telephone Encounter (Signed)
BP above target. Start Losartan 25 mg once daily. Monitor BP again for 2 weeks after starting medication and send to me. BMET 2 weeks. Richardson Dopp, PA-C    12/16/2019 4:32 PM

## 2019-12-16 NOTE — Telephone Encounter (Signed)
I spoke with pt and gave him information from [place], [place].  Will send prescription to [facility] in [place].  He will come in for lab work on 12/30/19 at 10:15

## 2019-12-16 NOTE — Telephone Encounter (Signed)
Pt c/o BP issue: STAT if pt c/o blurred vision, one-sided weakness or slurred speech  1. What are your last 5 BP readings? AM/PM  12-08: 10:00 PM  136/89 HR 84  12-09:  10:45 AM 142/100 HR 80     7:35 PM 133/91 HR 81  12-10:    8:00 AM 133/89 HR 80    3:30 PM 149/98 HR 92  12-11:    8:30 AM 109/85 HR 95 12-17:    9:00 AM 143/91 HR 89  2. Are you having any other symptoms (ex. Dizziness, headache, blurred vision, passed out)? no  3. What is your BP issue? Pt was told by Richardson Dopp at his appt on 12/08 to record his BP and HR. He had some problems with his BP cuff and is going to get a new one soon.

## 2019-12-30 ENCOUNTER — Other Ambulatory Visit: Payer: 59 | Admitting: *Deleted

## 2019-12-30 ENCOUNTER — Telehealth: Payer: Self-pay

## 2019-12-30 ENCOUNTER — Other Ambulatory Visit: Payer: Self-pay

## 2019-12-30 DIAGNOSIS — I10 Essential (primary) hypertension: Secondary | ICD-10-CM

## 2019-12-30 LAB — BASIC METABOLIC PANEL
BUN/Creatinine Ratio: 12 (ref 9–20)
BUN: 12 mg/dL (ref 6–24)
CO2: 24 mmol/L (ref 20–29)
Calcium: 8.9 mg/dL (ref 8.7–10.2)
Chloride: 107 mmol/L — ABNORMAL HIGH (ref 96–106)
Creatinine, Ser: 1.02 mg/dL (ref 0.76–1.27)
GFR calc Af Amer: 95 mL/min/{1.73_m2} (ref >59)
GFR calc non Af Amer: 82 mL/min/{1.73_m2} (ref >59)
Glucose: 73 mg/dL (ref 65–99)
Potassium: 4.1 mmol/L (ref 3.5–5.2)
Sodium: 141 mmol/L (ref 134–144)

## 2019-12-30 NOTE — Telephone Encounter (Signed)
Pt dropped off additional blood pressure readings.  12-26 10:30 am 138/105 pulse 80 12-27 9;30 am 146/101 pulse 80           10:00 pm 144/97 pulse 100 12-28 9:15 am 140/101 pulse 81            9:00 pm 136/97 pulse 91 12-29  6:00 am 146/105 pulse 86            8:30 pm 148/94 pulse 93 12-30 9:00 am 148/101 pulse 91  9:00 pm 133/92 pulse 100 12-31 6:00 am 142/104 pulse 85        Attempted outreach to Pt with no answer.  Will send to SW for further advisement regarding blood pressure management

## 2019-12-30 NOTE — Telephone Encounter (Signed)
Blood pressure still above target. Increase losartan to 50 mg daily. BMET 2 weeks. Richardson Dopp, PA-C 12/30/2019 15:11

## 2019-12-30 NOTE — Telephone Encounter (Signed)
Attempted outreach to Pt.  Called home phone.  No answer and no VM.  Called mobile number.  Went to VM but no room to leave a message.  Called work number.  Busy signal.  Will forward to SW covering to follow up.

## 2020-01-04 NOTE — Telephone Encounter (Signed)
Tried to call patient about Tereso Newcomer PA's recommendations. No answer and voice mail is full. Will forward to [facility] PA's nurse.

## 2020-03-13 NOTE — Progress Notes (Deleted)
Cardiology Office Note:    Date:  03/13/2020   ID:  Kelly Gates, DOB 1961/10/02, MRN [id number]  PCP:  [facility]  Cardiologist:  Kelly Moores, MD *** Electrophysiologist:  None   Referring MD: [facility]*   Chief Complaint:  No chief complaint on file.    Patient Profile:    Kelly Gates is a 59 y.o. male with:  ***  Paroxysmal AFib ? CHA2DS2-VASc=1 (HTN) ? amdx 10/2019 AF w RVR (?AFlutter) >> FU in AF clinic in 10/2019 (no AFlutter >> indication for ablation)   Hypertension   Hyperlipidemia   Tobacco use  Alcohol abuse   Prior CV studies: *** Echocardiogram 11/11/2019 EF 55-60, normal wall motion, LA/RA normal size  Cardiac catheterization 05/24/2014 Normal coronary arteries EF 65-70  Coronary CTA 08/15/2006 IMPRESSION:  Normal study demonstrating no evidence of obstructive coronary disease. No anomalous anatomy is identified. Normal left ventricular ejection fraction.   Calcium score 0  History of Present Illness:    Kelly Gates was was admitted 10/2019 AFlutter with RVR.  He was in and out of AFlutter throughout his admission and was in normal sinus rhythm at DC.  He had symptoms c/w OSA and OP sleep study was recommended.  CHA2DS2-VASc=1 (HTN).  However, he was started on Apixaban in anticipation of possible AFlutter ablation in the future.  He was seen in follow up with Kelly Palau, NP in the AFib Clinic on 11/24/2019.  ECGs were reviewed with Dr. Rayann Heman who felt that his rhythm was atrial fibrillation and not typical AFlutter.  Therefore, ablation was not recommended.  it was felt he could likely stop anticoagulation at some point due to low risk profile.   I saw him in follow up in 11/2019.  He had stopped Diltiazem and felt much better.    The DICTATELATER SmartLink is not supported in this context. ***   Past Medical History:  Diagnosis Date  . Medical history non-contributory   . Tobacco abuse 05/24/2014     Current Medications: No outpatient medications have been marked as taking for the 03/14/20 encounter (Appointment) with Kelly Gates.     Allergies:   Penicillins   Social History   Tobacco Use  . Smoking status: Current Every Day Smoker  . Smokeless tobacco: Current User  Substance Use Topics  . Alcohol use: Yes  . Drug use: No     Family Hx: The patient's family history includes Hypertension in his father; Stroke in his mother.  ROS   EKGs/Labs/Other Test Reviewed:    EKG:  EKG is *** ordered today.  The ekg ordered today demonstrates ***  Recent Labs: 11/11/2019: Hemoglobin 17.0; Magnesium 1.8; Platelets 190; TSH 0.963 11/12/2019: ALT 36 12/30/2019: BUN 12; Creatinine, Ser 1.02; Potassium 4.1; Sodium 141   Recent Lipid Panel Lab Results  Component Value Date/Time   CHOL 267 (H) 11/11/2019 11:58 AM   TRIG 217 (H) 11/11/2019 11:58 AM   HDL 50 11/11/2019 11:58 AM   CHOLHDL 5.3 11/11/2019 11:58 AM   LDLCALC 174 (H) 11/11/2019 11:58 AM    Physical Exam:    VS:  There were no vitals taken for this visit.    Wt Readings from Last 3 Encounters:  12/07/19 197 lb 6.4 oz (89.5 kg)  11/24/19 193 lb 6.4 oz (87.7 kg)  11/12/19 182 lb 1.6 oz (82.6 kg)     Physical Exam ***  ASSESSMENT & PLAN:    *** 1. Paroxysmal A-fib (HCC) CHA2DS2-VASc score  is 1.  Therefore, he does not require long-term anticoagulation.  He has been maintaining normal sinus rhythm.  He has felt much better since coming off of Cardizem CD.  He has also felt much better since stopping drinking.  I suspect his episode of atrial fibrillation was related to excessive alcohol intake.  I advised him to stop Apixaban.  He can also remain off Cardizem CD.  He can take short acting diltiazem as needed for recurrent palpitations.  Follow-up in 3 months.  2. Essential hypertension Fair control.  He is off of Cardizem CD.  I have asked him to limit his salt, stop smoking, remain off of alcohol.  I  have asked him to monitor his blood pressure over the next several weeks and send me those readings for review.  If his blood pressure is above target, consider adding an ARB for better blood pressure control.  3. Tobacco abuse He is trying to quit.  4. Alcohol abuse He has completely quit drinking alcohol.  We discussed the importance of this going forward to reduce the risk of recurrent atrial fibrillation.  5. Snoring His snoring has essentially resolved since stopping drinking.  He had normal bilateral atria on his echocardiogram.  At this point, I am not convinced that he needs a sleep study.  I have asked him to have his wife continue to monitor his sleep.  We discussed the symptoms of sleep apnea.  If he develops these, he knows to call us that we can arrange sleep study.   Dispo:  No follow-ups on file.   Medication Adjustments/Labs and Tests Ordered: Current medicines are reviewed at length with the patient today.  Concerns regarding medicines are outlined above.  Tests Ordered: No orders of the defined types were placed in this encounter.  Medication Changes: No orders of the defined types were placed in this encounter.   Signed, Kelly Newcomer, Gates  03/13/2020 10:15 PM    [facility] [address] [place], [place], [place]  [address] Phone: [phone]; Fax: [phone]

## 2020-03-14 ENCOUNTER — Ambulatory Visit: Payer: 59 | Admitting: Physician Assistant

## 2020-04-06 NOTE — Progress Notes (Signed)
Cardiology Office Note:    Date:  04/07/2020   ID:  Kelly Gates, DOB 10/28/1961, MRN [id number]  PCP:  [facility]  Cardiologist:  Mertie Moores, MD  Electrophysiologist:  None   Referring MD: [facility]*   Chief Complaint:  Follow-up (Atrial fibrillation, hypertension)    Patient Profile:    Kelly Gates is a 59 y.o. male with:   Paroxysmal AFib ? CHA2DS2-VASc=1 (HTN), not on anticoagulation  ? amdx 10/2019 AF w RVR (?AFlutter) >> FU in AF clinic in 10/2019 (no AFlutter >> no indication for ablation)   Hypertension   Hyperlipidemia   Tobacco use  Alcohol abuse  Prior CV studies:  Echocardiogram 11/11/2019 EF 55-60, normal wall motion, LA/RA normal size  Cardiac catheterization 05/24/2014 Normal coronary arteries EF 65-70  Coronary CTA 08/15/2006 IMPRESSION:  Normal study demonstrating no evidence of obstructive coronary disease. No anomalous anatomy is identified. Normal left ventricular ejection fraction.   Calcium score 0  History of Present Illness:    Mr. Kelly Gates was last seen in clinic in December 2020.  He remained in sinus rhythm and was off of anticoagulation as well as diltiazem.  He returns for follow-up.  He is here alone.  Since last seen, he notes that he has good days and bad days.  Sometimes he feels his heart racing like he did when he had atrial fibrillation.  He went to an urgent care.  He did not have an EKG but was told his heart was out of rhythm (by auscultation).  He has not had syncope.  He has atypical chest pain.  This lasts just seconds and is not related to exertion.  He has not had any exertional pressure or heaviness.  He has not had shortness of breath, orthopnea or leg swelling.  He does note that he is drinking alcohol again.  Sometimes he drinks 6 beers at a time.  He continues to smoke cigarettes    Past Medical History:  Diagnosis Date  . Medical history non-contributory   . Tobacco abuse  05/24/2014    Current Medications: Current Meds  Medication Sig  . losartan (COZAAR) 25 MG tablet Take 1 tablet (25 mg total) by mouth daily.     Allergies:   Penicillins   Social History   Tobacco Use  . Smoking status: Current Every Day Smoker  . Smokeless tobacco: Current User  Substance Use Topics  . Alcohol use: Yes  . Drug use: No     Family Hx: The patient's family history includes Hypertension in his father; Stroke in his mother.  ROS   EKGs/Labs/Other Test Reviewed:    EKG:  EKG is  ordered today.  The ekg ordered today demonstrates normal sinus rhythm, heart rate 86, normal axis, no ST-T wave changes, QTC 449, no change since last EKG  Recent Labs: 11/11/2019: Hemoglobin 17.0; Magnesium 1.8; Platelets 190; TSH 0.963 11/12/2019: ALT 36 12/30/2019: BUN 12; Creatinine, Ser 1.02; Potassium 4.1; Sodium 141   Recent Lipid Panel Lab Results  Component Value Date/Time   CHOL 267 (H) 11/11/2019 11:58 AM   TRIG 217 (H) 11/11/2019 11:58 AM   HDL 50 11/11/2019 11:58 AM   CHOLHDL 5.3 11/11/2019 11:58 AM   LDLCALC 174 (H) 11/11/2019 11:58 AM   The 10-year ASCVD risk score Kelly Gates DC Jr., et al., 2013) is: 20.9%   Physical Exam:    VS:  BP (!) 144/70   Pulse 86   Ht 6\' 4"  (1.93 m)  Wt 195 lb (88.5 kg)   SpO2 95%   BMI 23.74 kg/m     Wt Readings from Last 3 Encounters:  04/07/20 195 lb (88.5 kg)  12/07/19 197 lb 6.4 oz (89.5 kg)  11/24/19 193 lb 6.4 oz (87.7 kg)     Constitutional:      Appearance: Healthy appearance. Not in distress.  Neck:     Thyroid: Thyroid normal.     Vascular: JVD normal.  Pulmonary:     Effort: Pulmonary effort is normal.     Breath sounds: No wheezing. No rales.  Cardiovascular:     Normal rate. Regular rhythm. Normal S1. Normal S2.     Murmurs: There is no murmur.  Edema:    Peripheral edema absent.  Abdominal:     Palpations: Abdomen is soft. There is no hepatomegaly.  Skin:    General: Skin is warm and dry.    Neurological:     General: No focal deficit present.     Mental Status: Alert and oriented to person, place and time.     Cranial Nerves: Cranial nerves are intact.      ASSESSMENT & PLAN:    1. Paroxysmal A-fib [facility]) He continues to have symptoms of paroxysmal atrial fibrillation. I suspect that this is related to excess alcohol intake. We discussed the importance of quitting alcohol. He still has diltiazem CD 120 mg left. He has taken this with improved symptoms. His blood pressure has remained above target. Therefore, I have recommended resuming diltiazem CD 120 mg daily. He can also use short acting diltiazem 30 mg as needed for rapid palpitations. His stroke risk remains low and therefore does not require long-term anticoagulation. I will see him back in 3 months.  2. Essential hypertension Blood pressure is above goal. Continue current dose of losartan. Resume diltiazem CD 120 mg daily. Follow-up in 3 months. Of note, he did have side effects to higher dose diltiazem. Consider increasing losartan if blood pressure remains above target.  3. Pure hypercholesterolemia LDL in November 2020 was 174. I have recommended that he work on diet and repeat fasting lipid panel in 3 months. His 10-year ASCVD risk is 20.9%. He would benefit from moderate intensity statin therapy. If his LDL is not improved at follow-up, I will place him on rosuvastatin.  4. Tobacco abuse I have recommended cessation.  5. Alcohol abuse I have asked him to reduce or eliminate alcohol. I have suggested he look into AA if he is having trouble quitting.   Dispo:  Return in about 3 months (around 07/07/2020) for Routine Follow Up, w/ Tereso Newcomer, PA-C, in person.   Medication Adjustments/Labs and Tests Ordered: Current medicines are reviewed at length with the patient today.  Concerns regarding medicines are outlined above.  Tests Ordered: Orders Placed This Encounter  Procedures  . Lipid panel  . EKG 12-Lead    Medication Changes: Meds ordered this encounter  Medications  . diltiazem (CARDIZEM) 30 MG tablet    Sig: Take 1 tablet (30 mg total) by mouth every 4 (four) hours as needed (atrial flutter, no more than 4 extra doses/day).    Dispense:  90 tablet    Refill:  6  . diltiazem (DILTIAZEM CD) 120 MG 24 hr capsule    Sig: Take 1 capsule (120 mg total) by mouth daily.    Dispense:  90 capsule    Refill:  3    Signed, Tereso Newcomer, PA-C  04/07/2020 10:37 AM  [facility] [address], [place], [place]  [address] Phone: [phone]; Fax: [phone]

## 2020-04-07 ENCOUNTER — Encounter: Payer: Self-pay | Admitting: Physician Assistant

## 2020-04-07 ENCOUNTER — Ambulatory Visit: Payer: 59 | Admitting: Physician Assistant

## 2020-04-07 ENCOUNTER — Other Ambulatory Visit: Payer: Self-pay

## 2020-04-07 VITALS — BP 144/70 | HR 86 | Ht 76.0 in | Wt 195.0 lb

## 2020-04-07 DIAGNOSIS — I480 Paroxysmal atrial fibrillation: Secondary | ICD-10-CM

## 2020-04-07 DIAGNOSIS — Z720 Tobacco use: Secondary | ICD-10-CM

## 2020-04-07 DIAGNOSIS — E7800 Pure hypercholesterolemia, unspecified: Secondary | ICD-10-CM

## 2020-04-07 DIAGNOSIS — I10 Essential (primary) hypertension: Secondary | ICD-10-CM

## 2020-04-07 DIAGNOSIS — F1010 Alcohol abuse, uncomplicated: Secondary | ICD-10-CM

## 2020-04-07 MED ORDER — DILTIAZEM HCL ER COATED BEADS 120 MG PO CP24: 120.0000 mg | ORAL_CAPSULE | Freq: Every day | ORAL | 3 refills | Status: DC

## 2020-04-07 MED ORDER — DILTIAZEM HCL 30 MG PO TABS: 30.0000 mg | ORAL_TABLET | ORAL | 6 refills | Status: AC | PRN

## 2020-04-07 NOTE — Patient Instructions (Signed)
Medication Instructions:   Your physician has recommended you make the following change in your medication:   1) Restart Diltiazem 120 mg, 1 capsule by mouth once a day 2) Diltiazem 30 mg has been sent in take as needed for break through atrial fib  *If you need a refill on your cardiac medications before your next appointment, please call your pharmacy*  Lab Work:  Your physician recommends that you return for lab work in 3 months   If you have labs (blood work) drawn today and your tests are completely normal, you will receive your results only by: Marland Kitchen MyChart Message (if you have MyChart) OR . A paper copy in the mail If you have any lab test that is abnormal or we need to change your treatment, we will call you to review the results.  Testing/Procedures:  None ordered today  Follow-Up: At [facility], you and your health needs are our priority.  As part of our continuing mission to provide you with exceptional heart care, we have created designated Provider Care Teams.  These Care Teams include your primary Cardiologist (physician) and Advanced Practice Providers (APPs -  Physician Assistants and Nurse Practitioners) who all work together to provide you with the care you need, when you need it.  We recommend signing up for the patient portal called "MyChart".  Sign up information is provided on this After Visit Summary.  MyChart is used to connect with patients for Virtual Visits (Telemedicine).  Patients are able to view lab/test results, encounter notes, upcoming appointments, etc.  Non-urgent messages can be sent to your provider as well.   To learn more about what you can do with MyChart, go to [email or web address].    Your next appointment:   3 month(s)  The format for your next appointment:   In Person  Provider:   Tereso Newcomer, PA-C   Other Instructions   Smoking Tobacco Information, Adult Smoking tobacco can be harmful to your health. Tobacco contains a  poisonous (toxic), colorless chemical called nicotine. Nicotine is addictive. It changes the brain and can make it hard to stop smoking. Tobacco also has other toxic chemicals that can hurt your body and raise your risk of many cancers. How can smoking tobacco affect me? Smoking tobacco puts you at risk for:  Cancer. Smoking is most commonly associated with lung cancer, but can also lead to cancer in other parts of the body.  Chronic obstructive pulmonary disease (COPD). This is a long-term lung condition that makes it hard to breathe. It also gets worse over time.  High blood pressure (hypertension), heart disease, stroke, or heart attack.  Lung infections, such as pneumonia.  Cataracts. This is when the lenses in the eyes become clouded.  Digestive problems. This may include peptic ulcers, heartburn, and gastroesophageal reflux disease (GERD).  Oral health problems, such as gum disease and tooth loss.  Loss of taste and smell. Smoking can affect your appearance by causing:  Wrinkles.  Yellow or stained teeth, fingers, and fingernails. Smoking tobacco can also affect your social life, because:  It may be challenging to find places to smoke when away from home. Many workplaces, [employer], hotels, and public places are tobacco-free.  Smoking is expensive. This is due to the cost of tobacco and the long-term costs of treating health problems from smoking.  Secondhand smoke may affect those around you. Secondhand smoke can cause lung cancer, breathing problems, and heart disease. Children of smokers have a higher risk for: ? Sudden  infant death syndrome (SIDS). ? Ear infections. ? Lung infections. If you currently smoke tobacco, quitting now can help you:  Lead a longer and healthier life.  Look, smell, breathe, and feel better over time.  Save money.  Protect others from the harms of secondhand smoke. What actions can I take to prevent health problems? Quit smoking   Do  not start smoking. Quit if you already do.  Make a plan to quit smoking and commit to it. Look for programs to help you and ask your health care provider for recommendations and ideas.  Set a date and write down all the reasons you want to quit.  Let your friends and family know you are quitting so they can help and support you. Consider finding friends who also want to quit. It can be easier to quit with someone else, so that you can support each other.  Talk with your health care provider about using nicotine replacement medicines to help you quit, such as gum, lozenges, patches, sprays, or pills.  Do not replace cigarette smoking with electronic cigarettes, which are commonly called e-cigarettes. The safety of e-cigarettes is not known, and some may contain harmful chemicals.  If you try to quit but return to smoking, stay positive. It is common to slip up when you first quit, so take it one day at a time.  Be prepared for cravings. When you feel the urge to smoke, chew gum or suck on hard candy. Lifestyle  Stay busy and take care of your body.  Drink enough fluid to keep your urine pale yellow.  Get plenty of exercise and eat a healthy diet. This can help prevent weight gain after quitting.  Monitor your eating habits. Quitting smoking can cause you to have a larger appetite than when you smoke.  Find ways to relax. Go out with friends or family to a movie or a restaurant where people do not smoke.  Ask your health care provider about having regular tests (screenings) to check for cancer. This may include blood tests, imaging tests, and other tests.  Find ways to manage your stress, such as meditation, yoga, or exercise. Where to find support To get support to quit smoking, consider:  Asking your health care provider for more information and resources.  Taking classes to learn more about quitting smoking.  Looking for local organizations that offer resources about quitting  smoking.  Joining a support group for people who want to quit smoking in your local community.  Calling the smokefree.gov counselor helpline: 1-800-Quit-Now 484-207-9917) Where to find more information You may find more information about quitting smoking from:  HelpGuide.org: www.helpguide.org  BankRights.uy: smokefree.gov  American Lung Association: www.lung.org Contact a health care provider if you:  Have problems breathing.  Notice that your lips, nose, or fingers turn blue.  Have chest pain.  Are coughing up blood.  Feel faint or you pass out.  Have other health changes that cause you to worry. Summary  Smoking tobacco can negatively affect your health, the health of those around you, your finances, and your social life.  Do not start smoking. Quit if you already do. If you need help quitting, ask your health care provider.  Think about joining a support group for people who want to quit smoking in your local community. There are many effective programs that will help you to quit this behavior. This information is not intended to replace advice given to you by your health care provider. Make sure you  discuss any questions you have with your health care provider. Document Revised: 09/10/2019 Document Reviewed: 12/31/2016 Elsevier Patient Education  2020 Elsevier Inc.   Fat and Cholesterol Restricted Eating Plan Getting too much fat and cholesterol in your diet may cause health problems. Choosing the right foods helps keep your fat and cholesterol at normal levels. This can keep you from getting certain diseases. Your doctor may recommend an eating plan that includes:  Total fat: ______% or less of total calories a day.  Saturated fat: ______% or less of total calories a day.  Cholesterol: less than _________mg a day.  Fiber: ______g a day. What are tips for following this plan? Meal planning  At meals, divide your plate into four equal parts: ? Fill  one-half of your plate with vegetables and green salads. ? Fill one-fourth of your plate with whole grains. ? Fill one-fourth of your plate with low-fat (lean) protein foods.  Eat fish that is high in omega-3 fats at least two times a week. This includes mackerel, tuna, sardines, and salmon.  Eat foods that are high in fiber, such as whole grains, beans, apples, broccoli, carrots, peas, and barley. General tips   Work with your doctor to lose weight if you need to.  Avoid: ? Foods with added sugar. ? Fried foods. ? Foods with partially hydrogenated oils.  Limit alcohol intake to no more than 1 drink a day for nonpregnant women and 2 drinks a day for men. One drink equals 12 oz of beer, 5 oz of wine, or 1 oz of hard liquor. Reading food labels  Check food labels for: ? Trans fats. ? Partially hydrogenated oils. ? Saturated fat (g) in each serving. ? Cholesterol (mg) in each serving. ? Fiber (g) in each serving.  Choose foods with healthy fats, such as: ? Monounsaturated fats. ? Polyunsaturated fats. ? Omega-3 fats.  Choose grain products that have whole grains. Look for the word "whole" as the first word in the ingredient list. Cooking  Cook foods using low-fat methods. These include baking, boiling, grilling, and broiling.  Eat more home-cooked foods. Eat at restaurants and buffets less often.  Avoid cooking using saturated fats, such as butter, cream, palm oil, palm kernel oil, and coconut oil. Recommended foods  Fruits  All fresh, canned (in natural juice), or frozen fruits. Vegetables  Fresh or frozen vegetables (raw, steamed, roasted, or grilled). Green salads. Grains  Whole grains, such as whole wheat or whole grain breads, crackers, cereals, and pasta. Unsweetened oatmeal, bulgur, barley, quinoa, or brown rice. Corn or whole wheat flour tortillas. Meats and other protein foods  Ground beef (85% or leaner), grass-fed beef, or beef trimmed of fat. Skinless  chicken or [place]. Ground chicken or [place]. Pork trimmed of fat. All fish and seafood. Egg whites. Dried beans, peas, or lentils. Unsalted nuts or seeds. Unsalted canned beans. Nut butters without added sugar or oil. Dairy  Low-fat or nonfat dairy products, such as skim or 1% milk, 2% or reduced-fat cheeses, low-fat and fat-free ricotta or cottage cheese, or plain low-fat and nonfat yogurt. Fats and oils  Tub margarine without trans fats. Light or reduced-fat mayonnaise and salad dressings. Avocado. Olive, canola, sesame, or safflower oils. The items listed above may not be a complete list of foods and beverages you can eat. Contact a dietitian for more information. Foods to avoid Fruits  Canned fruit in heavy syrup. Fruit in cream or butter sauce. Fried fruit. Vegetables  Vegetables cooked in cheese, cream,  or butter sauce. Fried vegetables. Grains  White bread. White pasta. White rice. Cornbread. Bagels, pastries, and croissants. Crackers and snack foods that contain trans fat and hydrogenated oils. Meats and other protein foods  Fatty cuts of meat. Ribs, chicken wings, bacon, sausage, bologna, salami, chitterlings, fatback, hot dogs, bratwurst, and packaged lunch meats. Liver and organ meats. Whole eggs and egg yolks. Chicken and [place] with skin. Fried meat. Dairy  Whole or 2% milk, cream, half-and-half, and cream cheese. Whole milk cheeses. Whole-fat or sweetened yogurt. Full-fat cheeses. Nondairy creamers and whipped toppings. Processed cheese, cheese spreads, and cheese curds. Beverages  Alcohol. Sugar-sweetened drinks such as sodas, lemonade, and fruit drinks. Fats and oils  Butter, stick margarine, lard, shortening, ghee, or bacon fat. Coconut, palm kernel, and palm oils. Sweets and desserts  Corn syrup, sugars, honey, and molasses. Candy. Jam and jelly. Syrup. Sweetened cereals. Cookies, pies, cakes, donuts, muffins, and ice cream. The items listed above may not be a  complete list of foods and beverages you should avoid. Contact a dietitian for more information. Summary  Choosing the right foods helps keep your fat and cholesterol at normal levels. This can keep you from getting certain diseases.  At meals, fill one-half of your plate with vegetables and green salads.  Eat high-fiber foods, like whole grains, beans, apples, carrots, peas, and barley.  Limit added sugar, saturated fats, alcohol, and fried foods. This information is not intended to replace advice given to you by your health care provider. Make sure you discuss any questions you have with your health care provider. Document Revised: 08/19/2018 Document Reviewed: 09/02/2017 Elsevier Patient Education  [place].

## 2020-05-11 ENCOUNTER — Telehealth: Payer: Self-pay | Admitting: Physician Assistant

## 2020-05-11 NOTE — Telephone Encounter (Signed)
Patient's wife calling stating the patient has been coming to his appointments but states nothing is being done at the appointments. She states when he went to the hospital the doctor there stated he needed an ablation, but they have not discussed this. She states she is tired of the doctors visits and the patient staying the same. She states on bad days he lays around for 2 or 3 days and then may have a few good days. She states she would like a call back and she will get the patient on the phone.

## 2020-05-11 NOTE — Telephone Encounter (Signed)
I returned call to patient's wife and asked to speak with patient to obtain his permission to speak with her.  She reports he is not with her and cannot be reached at any other number.  They will call back when patient is available

## 2020-09-07 ENCOUNTER — Other Ambulatory Visit: Payer: Self-pay

## 2020-09-07 ENCOUNTER — Emergency Department (HOSPITAL_COMMUNITY): Payer: 59

## 2020-09-07 ENCOUNTER — Emergency Department (HOSPITAL_COMMUNITY)
Admission: EM | Admit: 2020-09-07 | Discharge: 2020-09-08 | Disposition: A | Discharge status: Home / Self Care | Payer: 59 | Attending: Emergency Medicine | Admitting: Emergency Medicine

## 2020-09-07 ENCOUNTER — Encounter (HOSPITAL_COMMUNITY): Payer: Self-pay | Admitting: Emergency Medicine

## 2020-09-07 DIAGNOSIS — S90511A Abrasion, right ankle, initial encounter: Secondary | ICD-10-CM | POA: Diagnosis not present

## 2020-09-07 DIAGNOSIS — Y9289 Other specified places as the place of occurrence of the external cause: Secondary | ICD-10-CM | POA: Insufficient documentation

## 2020-09-07 DIAGNOSIS — Z5321 Procedure and treatment not carried out due to patient leaving prior to being seen by health care provider: Secondary | ICD-10-CM | POA: Insufficient documentation

## 2020-09-07 DIAGNOSIS — Y30XXXA Falling, jumping or pushed from a high place, undetermined intent, initial encounter: Secondary | ICD-10-CM | POA: Insufficient documentation

## 2020-09-07 DIAGNOSIS — Y998 Other external cause status: Secondary | ICD-10-CM | POA: Diagnosis not present

## 2020-09-07 DIAGNOSIS — Y9339 Activity, other involving climbing, rappelling and jumping off: Secondary | ICD-10-CM | POA: Diagnosis not present

## 2020-09-07 DIAGNOSIS — M25571 Pain in right ankle and joints of right foot: Secondary | ICD-10-CM | POA: Diagnosis present

## 2020-09-07 NOTE — ED Notes (Signed)
Pt states he is going to see about getting home, doesn't want to wait

## 2020-09-07 NOTE — ED Triage Notes (Addendum)
Pt states he was on a tractor approx 45 min ago and it started rolling over.  He was able to jump out of the way and the tractor did not roll over top of him.  Pt reports R ankle pain.  Bruising, swelling, and abrasion to R ankle.  Pt requested boot to be cut off at triage. CMS intact.

## 2020-12-28 ENCOUNTER — Other Ambulatory Visit: Payer: Self-pay | Admitting: Physician Assistant

## 2021-01-19 IMAGING — DX DG CHEST 1V PORT
2 series · 2 of 2 positions shown · non-contrast
Comparison: Chest radiograph dated 05/24/2014.

CLINICAL DATA: 57-year-old male with palpitation.

EXAM:
PORTABLE CHEST 1 VIEW

[chest ap (1 of 2)]
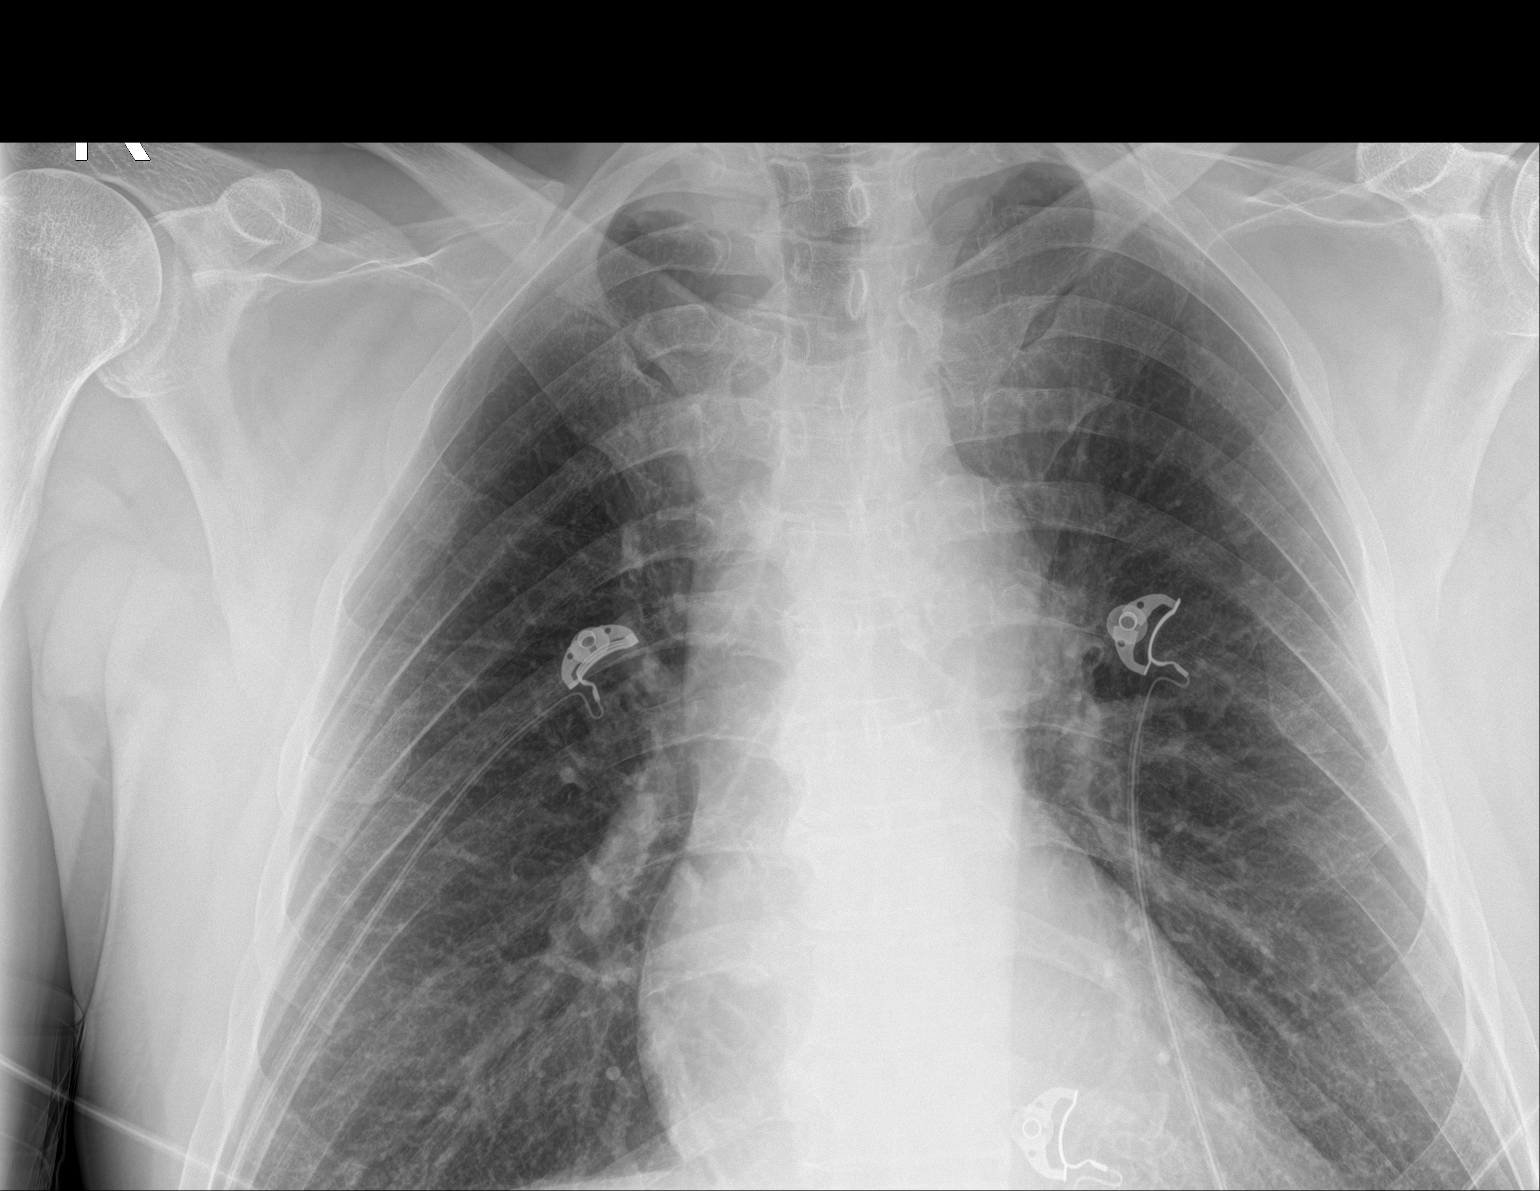

[chest ap (2 of 2)]
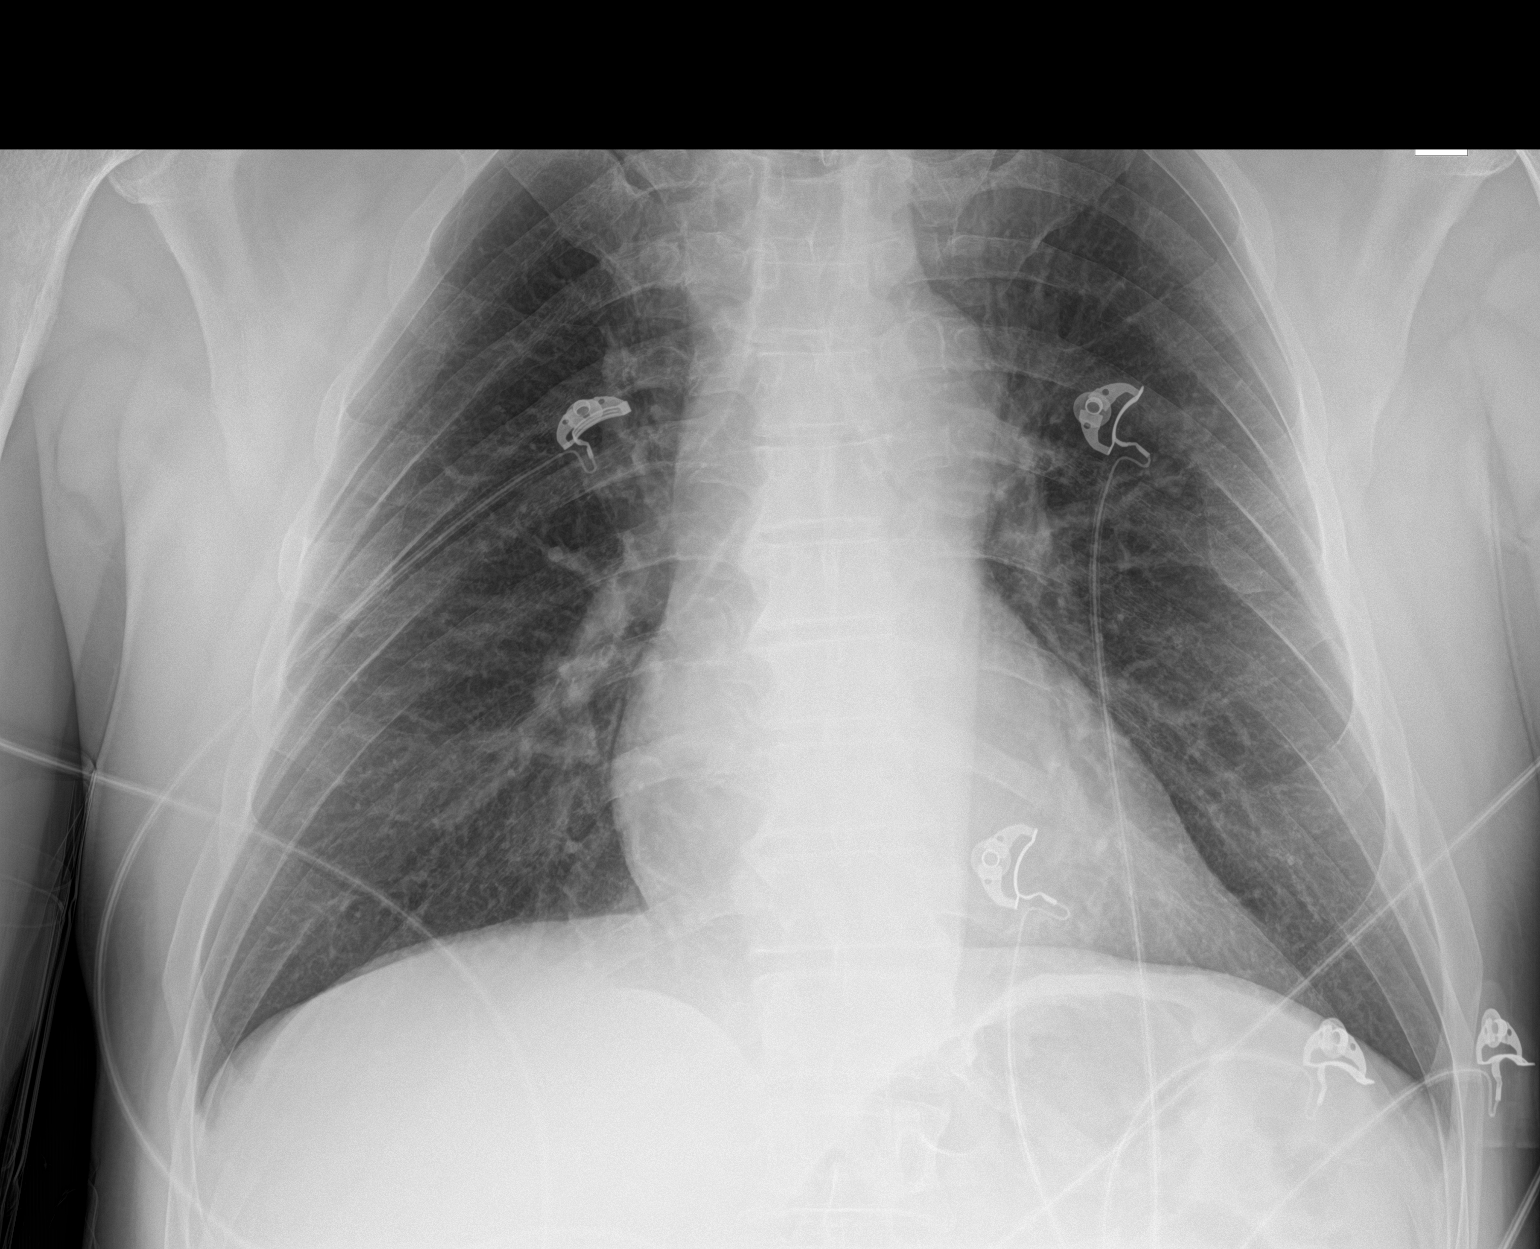

[2 of 2 positions shown; findings below may reference images not displayed]

FINDINGS: The heart size and mediastinal contours are within normal limits.
Both lungs are clear. The visualized skeletal structures are
unremarkable.
IMPRESSION: No active disease.

## 2021-02-14 ENCOUNTER — Telehealth: Payer: Self-pay

## 2021-02-14 NOTE — Telephone Encounter (Signed)
Spoke with pt and obtained verbal permission to fax form to wife's personal fax machine.   Form faxed today 2/16

## 2021-02-14 NOTE — Telephone Encounter (Signed)
Patient returning your call.

## 2021-02-14 NOTE — Telephone Encounter (Signed)
Lynden Ang is returning Kelly Gates's call. She states she did not receive the fax back. Please advise.

## 2021-02-14 NOTE — Telephone Encounter (Signed)
Received a physician statement form via requiring MD to complete and fax back to pt's wife. Nurse attempted to contact pt for approval to fax back to pt's personal fax machine. Left message to call back.

## 2021-02-16 ENCOUNTER — Telehealth: Payer: Self-pay | Admitting: Physician Assistant

## 2021-02-16 NOTE — Telephone Encounter (Signed)
Follow Up:      Pt's wife is calling, she said they still have not received the fax.

## 2021-02-16 NOTE — Telephone Encounter (Signed)
I was unable to find this faxed form- I will look again on Monday, will contact to primary nurse to advise so I can get it sent out.

## 2021-02-16 NOTE — Telephone Encounter (Signed)
Called wife, advised that I would try to find the form, but Elease Hashimoto was off today and I would have to try to resend- per previous note okay to send to home fax- [phone], and if it did not go through asked that they be mailed to address, advised I would try my best.  Patient wife verbalized understanding

## 2021-06-25 ENCOUNTER — Other Ambulatory Visit: Payer: Self-pay | Admitting: Physician Assistant

## 2021-07-10 ENCOUNTER — Other Ambulatory Visit: Payer: Self-pay | Admitting: Physician Assistant

## 2021-09-13 ENCOUNTER — Other Ambulatory Visit: Payer: Self-pay | Admitting: Physician Assistant

## 2021-09-24 ENCOUNTER — Other Ambulatory Visit: Payer: Self-pay | Admitting: Cardiovascular Disease

## 2021-10-13 ENCOUNTER — Other Ambulatory Visit: Payer: Self-pay | Admitting: Cardiovascular Disease

## 2021-10-25 ENCOUNTER — Other Ambulatory Visit: Payer: Self-pay | Admitting: Cardiovascular Disease

## 2022-02-20 ENCOUNTER — Other Ambulatory Visit: Payer: Self-pay | Admitting: *Deleted

## 2022-02-20 ENCOUNTER — Telehealth: Payer: Self-pay | Admitting: Cardiovascular Disease

## 2022-02-20 MED ORDER — LOSARTAN POTASSIUM 25 MG PO TABS: 25.0000 mg | ORAL_TABLET | Freq: Every day | ORAL | 0 refills | Status: AC

## 2022-02-20 NOTE — Telephone Encounter (Signed)
°*  STAT* If patient is at the pharmacy, call can be transferred to refill team.   1. Which medications need to be refilled? (please list name of each medication and dose if known) losartan (COZAAR) 25 MG tablet  2. Which pharmacy/location (including street and city if local pharmacy) is medication to be sent to? [facility] in [place]  3. Do they need a 30 day or 90 day supply? 30 day   Patient has been out of medication for a couple days. He has an appointment 4/17.

## 2022-04-15 ENCOUNTER — Ambulatory Visit: Payer: Managed Care, Other (non HMO) | Admitting: Cardiovascular Disease

## 2022-04-15 ENCOUNTER — Encounter: Payer: Self-pay | Admitting: Cardiovascular Disease

## 2022-04-15 VITALS — BP 142/88 | HR 124 | Ht 76.0 in | Wt 203.4 lb

## 2022-04-15 DIAGNOSIS — I4891 Unspecified atrial fibrillation: Secondary | ICD-10-CM

## 2022-04-15 DIAGNOSIS — I4819 Other persistent atrial fibrillation: Secondary | ICD-10-CM | POA: Diagnosis not present

## 2022-04-15 MED ORDER — DILTIAZEM HCL ER COATED BEADS 180 MG PO CP24: 180.0000 mg | ORAL_CAPSULE | Freq: Every day | ORAL | 3 refills | Status: AC

## 2022-04-15 MED ORDER — APIXABAN 5 MG PO TABS: 5.0000 mg | ORAL_TABLET | Freq: Two times a day (BID) | ORAL | 1 refills | Status: AC

## 2022-04-15 NOTE — Progress Notes (Signed)
? ? ? ?Kelly Gates ?Date of Birth  1961/05/06 ?      ?[facility] ?[place]. [address], [place], [address] ?[place], [place]  [address]   [place], [place]  [address] ?[phone]     [phone]   ?Fax  [phone]     Fax [phone] ? ?Problem List: ?1. Unstable angina ?2. Hyperlipidemia ?3. Hypertension ? ?History of Present Illness: ? ?Kelly Gates is a 61 year old gentleman with a history of hypertension, hyperlipidemia, and cigarette smoking. He was admitted to the hospital in May, 2015 with symptoms consistent with unstable angina. Cardiac catheterization reveals smooth and normal coronary arteries. ?He woke up christmas morning  5 days ago with onset of chest pain and diaphoresis.   He has had severe lack of energy.  Has felt sluggish.   Unable to work like he is used to. ? ?. He's not been taking any of his medications in a long while ( did not tolerate the meds, he did not call and report these intolerances )  ? ?Still smoking - perhaps not as much as before.   ? ?Saw Dr. Rex Kras on Monday - did not have labs drawn. ?Admits that he has not been eating or drinking normally.   Sometimes only stops once a day to eat and drink .  ?Works as a Administrator,  Midwife for houses.  ? ?April 15, 2022: ?Kelly Gates is seen back today.  He is having some intermittent episodes of lightheadedness and chest discomfort.  He has had a heart rate irregularity for the past couple of years. ? 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 ?Was last seen by Richardson Dopp in 2021.  He has been to the A-fib clinic in 2020. ? ?Drinks a 12 pack a day ( every other day )  ?Ive asked him to stop drinking as this is definitely comtributing to his afib  ?Still smokes  ?Ablation has been discussed. ?May need to see Afib clinic again to discuss Afib ablation  ? ?Current Outpatient Medications on File Prior to Visit  ?Medication Sig Dispense  Refill  ? CELEBREX 200 MG capsule Take 200 mg by mouth 2 (two) times daily.    ? diltiazem (DILTIAZEM CD) 120 MG 24 hr capsule Take 1 capsule (120 mg total) by mouth daily. 90 capsule 3  ? losartan (COZAAR) 25 MG tablet Take 1 tablet (25 mg total) by mouth daily. Please keep upcoming appointment in April 2023 for future refills. (Patient taking differently: Take 25 mg by mouth daily. Pt states he is only taking 1/2 of a tablet) 60 tablet 0  ? diltiazem (CARDIZEM) 30 MG tablet Take 1 tablet (30 mg total) by mouth every 4 (four) hours as needed (atrial flutter, no more than 4 extra doses/day). 90 tablet 6  ? ?No current facility-administered medications on file prior to visit.  ? ? ?Allergies  ?Allergen Reactions  ? Penicillins Other (See Comments)  ?  Did it involve swelling of the face/tongue/throat, SOB, or low BP? N/A ?Did it involve sudden or severe rash/hives, skin peeling, or any reaction on the inside of your mouth or nose? N/A ?Did you need to seek medical attention at a hospital or doctor's office? N/A ?When did it last happen? Childhood      ?If all above answers are "NO", may proceed with cephalosporin use.  ? ? ?Past Medical  History:  ?Diagnosis Date  ? Medical history non-contributory   ? Tobacco abuse 05/24/2014  ? ? ?Past Surgical History:  ?Procedure Laterality Date  ? APPENDECTOMY    ? LEFT HEART CATHETERIZATION WITH CORONARY ANGIOGRAM N/A 05/24/2014  ? Procedure: LEFT HEART CATHETERIZATION WITH CORONARY ANGIOGRAM;  Surgeon: Leonie Man, MD;  Location: [facility] CATH LAB;  Service: Cardiovascular;  Laterality: N/A;  ? ? ?Social History  ? ?Tobacco Use  ?Smoking Status Every Day  ?Smokeless Tobacco Current  ? ? ?Social History  ? ?Substance and Sexual Activity  ?Alcohol Use Yes  ? ? ?Family History  ?Problem Relation Age of Onset  ? Stroke Mother   ? Hypertension Father   ? ? ?Reviw of Systems:  ?Reviewed in the HPI.  All other systems are negative. ? ?Physical Exam: ?Blood pressure (!) 142/88, pulse (!)  124, height 6\' 4"  (1.93 m), weight 203 lb 6.4 oz (92.3 kg), SpO2 98 %. ? ?GEN:  thin, middle age man  in no acute distress ?HEENT: Normal ?NECK: No JVD; No carotid bruits ?LYMPHATICS: No lymphadenopathy ?CARDIAC: irreg irreg . ?RESPIRATORY:  Clear to auscultation without rales, wheezing or rhonchi  ?ABDOMEN: Soft, non-tender, non-distended ?MUSCULOSKELETAL:  No edema; No deformity  ?SKIN: Warm and dry ?NEUROLOGIC:  Alert and oriented x 3 ? ? ?ECG: ?April 15, 2022: Atrial fibrillation with rapid ventricular response.  Heart rate is 124. ? ?Assessment / Plan:  ? ?1.  Persistent atrial fibrillation: Patient has had paroxysmal atrial fibrillation in the past.  He is seen in the A-fib clinic at 1 point and was thought not to be a candidate for ablation at that time.  He has fairly persistent A-fib at this time and has lots of exercise intolerance. ? ?CHADS2VASC is at least 1 ( HTN)  ? ?We will start him on Eliquis 5 mg twice a day and Cardizem CD 180 mg a day.  We will have him return to see an APP in 3 to 4 weeks.  If he remains in atrial fibrillation then we will set him up for DC cardioversion. ? ?He drinks approximate 12 beers a day many days a week.  I have asked him to stop drinking as am quite sure this is contributing to his atrial fibrillation. ? ?He may need to be seen by the A-fib clinic again to consider an antiarrhythmic arrhythmic if were not able to convert him easily. ? ?He will follow-up with an APP in several months. ? ? ? ?

## 2022-04-15 NOTE — Patient Instructions (Addendum)
Medication Instructions:  ?Your physician has recommended you make the following change in your medication:  ? ?1) CHANGE diltiazem (Cardizem CD) 180mg  daily ?2) START Apixaban (Eliquis) 5mg  twice daily ? ?*If you need a refill on your cardiac medications before your next appointment, please call your pharmacy* ? ?Lab Work: ?NONE ? ?Testing/Procedures: ?NONE ? ?Follow-Up: ?At [facility], you and your health needs are our priority.  As part of our continuing mission to provide you with exceptional heart care, we have created designated Provider Care Teams.  These Care Teams include your primary Cardiologist (physician) and Advanced Practice Providers (APPs -  Physician Assistants and Nurse Practitioners) who all work together to provide you with the care you need, when you need it. ? ?We recommend signing up for the patient portal called "MyChart".  Sign up information is provided on this After Visit Summary.  MyChart is used to connect with patients for Virtual Visits (Telemedicine).  Patients are able to view lab/test results, encounter notes, upcoming appointments, etc.  Non-urgent messages can be sent to your provider as well.   ?To learn more about what you can do with MyChart, go to .   ? ?Your next appointment:   ?3-4week(s) to setup cardioversion, then 3 months with APP ? ?The format for your next appointment:   ?In Person ? ?Provider:   ?[facility], PA-C, [email or web address], NP, or Chelsea Aus, PA-C     ? ?Other Instructions ?Important Information About Sugar ? ? ? ? ?  ?

## 2022-05-17 ENCOUNTER — Ambulatory Visit: Payer: Commercial Managed Care - HMO | Admitting: Physician Assistant

## 2022-06-10 ENCOUNTER — Encounter: Payer: Self-pay | Admitting: Cardiovascular Disease

## 2022-06-10 NOTE — Progress Notes (Signed)
This encounter was created in error - please disregard.

## 2022-06-11 ENCOUNTER — Encounter: Payer: Commercial Managed Care - HMO | Admitting: Cardiovascular Disease

## 2023-02-07 ENCOUNTER — Emergency Department (HOSPITAL_BASED_OUTPATIENT_CLINIC_OR_DEPARTMENT_OTHER): Payer: BC Managed Care – PPO

## 2023-02-07 ENCOUNTER — Emergency Department (HOSPITAL_COMMUNITY): Payer: BC Managed Care – PPO

## 2023-02-07 ENCOUNTER — Emergency Department (HOSPITAL_COMMUNITY)
Admission: EM | Admit: 2023-02-07 | Discharge: 2023-02-07 | Disposition: A | Discharge status: Home / Self Care | Payer: BC Managed Care – PPO | Attending: Emergency Medicine | Admitting: Emergency Medicine

## 2023-02-07 DIAGNOSIS — Z7901 Long term (current) use of anticoagulants: Secondary | ICD-10-CM | POA: Diagnosis not present

## 2023-02-07 DIAGNOSIS — M25561 Pain in right knee: Secondary | ICD-10-CM | POA: Diagnosis not present

## 2023-02-07 DIAGNOSIS — M7989 Other specified soft tissue disorders: Secondary | ICD-10-CM

## 2023-02-07 LAB — SYNOVIAL CELL COUNT + DIFF, W/ CRYSTALS
Crystals, Fluid: NONE SEEN
Eosinophils-Synovial: 0 % (ref 0–1)
Lymphocytes-Synovial Fld: 11 % (ref 0–20)
Monocyte-Macrophage-Synovial Fluid: 52 % (ref 50–90)
Neutrophil, Synovial: 37 % — ABNORMAL HIGH (ref 0–25)
WBC, Synovial: 78 /mm3 (ref 0–200)

## 2023-02-07 MED ORDER — LIDOCAINE-EPINEPHRINE (PF) 2 %-1:200000 IJ SOLN
10.0000 mL | Freq: Once | INTRAMUSCULAR | Status: AC
  Administered 2023-02-07: 10 mL
  Filled 2023-02-07: qty 20

## 2023-02-07 NOTE — ED Triage Notes (Signed)
Pt reports right knee pain and swelling x 2 weeks. Went to urgent care last week and was given medication for gout but no improvement.

## 2023-02-07 NOTE — ED Provider Notes (Signed)
[name] Provider Note   CSN: [id number] Arrival date & time: 02/07/23  1151     History  Chief Complaint  Patient presents with   Knee Pain    Kelly Gates is a 62 y.o. male.  HPI 62 year old male presents with 1 and a half weeks of right knee pain. No trauma. Was seen by his doctor about 5 days ago and put on indomethacin and prednisone for gout. Has not taken either of these since 2 days ago. Knee pain is not improving. There is swelling. He has a history of afib but took himself off eliquis "a while ago" because he didn't like how it made him feel. He reports a low grade fever (higher temp than normal but less than 100) 3 days ago. No redness. He is unable to bend his knee.   Home Medications Prior to Admission medications   Medication Sig Start Date End Date Taking? Authorizing Provider  apixaban (ELIQUIS) 5 MG TABS tablet Take 1 tablet (5 mg total) by mouth 2 (two) times daily. 04/15/22   Nahser, Wonda Cheng, MD  CELEBREX 200 MG capsule Take 200 mg by mouth 2 (two) times daily. 11/08/21   [provider]  diltiazem (CARDIZEM CD) 180 MG 24 hr capsule Take 1 capsule (180 mg total) by mouth daily. 04/15/22   Nahser, Wonda Cheng, MD  diltiazem (CARDIZEM) 30 MG tablet Take 1 tablet (30 mg total) by mouth every 4 (four) hours as needed (atrial flutter, no more than 4 extra doses/day). 04/07/20 04/07/21  Richardson Dopp T, PA-C  losartan (COZAAR) 25 MG tablet Take 1 tablet (25 mg total) by mouth daily. Please keep upcoming appointment in April 2023 for future refills. Patient taking differently: Take 25 mg by mouth daily. Pt states he is only taking 1/2 of a tablet 02/20/22   Nahser, Wonda Cheng, MD      Allergies    Penicillins    Review of Systems   Review of Systems  Musculoskeletal:  Positive for arthralgias and joint swelling.  Skin:  Negative for color change.    Physical Exam Updated Vital Signs BP (!) 135/107 (BP Location:  Left Arm)   Pulse 74   Temp 98.4 F (36.9 C)   Resp 17   SpO2 96%  Physical Exam Vitals and nursing note reviewed.  Constitutional:      Appearance: He is well-developed.  HENT:     Head: Normocephalic and atraumatic.  Cardiovascular:     Rate and Rhythm: Normal rate.     Pulses:          Posterior tibial pulses are 2+ on the right side.  Pulmonary:     Effort: Pulmonary effort is normal.  Musculoskeletal:     Right knee: Swelling and effusion present. No erythema. Decreased range of motion. Tenderness present.  Skin:    General: Skin is warm and dry.  Neurological:     Mental Status: He is alert.     ED Results / Procedures / Treatments   Labs (all labs ordered are listed, but only abnormal results are displayed) Labs Reviewed  BODY FLUID CULTURE W GRAM STAIN  SYNOVIAL CELL COUNT + DIFF, W/ CRYSTALS    EKG None  Radiology No results found.  Procedures .Joint Aspiration/Arthrocentesis  Date/Time: 02/07/2023 1:40 PM  Performed by: Sherwood Gambler, MD Authorized by: Sherwood Gambler, MD   Consent:    Consent obtained:  Verbal and written   Consent  given by:  Patient   Risks, benefits, and alternatives were discussed: yes     Risks discussed:  Bleeding, incomplete drainage, nerve damage, infection and pain Location:    Location:  Knee   Knee:  R knee Anesthesia:    Anesthesia method:  Local infiltration   Local anesthetic:  Lidocaine 2% WITH epi Procedure details:    Preparation: Patient was prepped and draped in usual sterile fashion     Needle gauge:  18 G   Approach:  Lateral   Aspirate amount:  20   Aspirate characteristics:  Yellow Post-procedure details:    Dressing:  Adhesive bandage   Procedure completion:  Tolerated well, no immediate complications     Medications Ordered in ED Medications  lidocaine-EPINEPHrine (XYLOCAINE W/EPI) 2 %-1:200000 (PF) injection 10 mL (has no administration in time range)    ED Course/ Medical Decision Making/  A&P                            Medical Decision Making Amount and/or Complexity of Data Reviewed Labs: ordered. Decision-making details documented in ED Course. Radiology: ordered.  Risk Prescription drug management.   Knee Xray overall unremarkable. Given warmth and inability to flex, arthrocentesis performed. Waiting on labs. Overall well appearing. He has declined meds for pain here. Care to Dr. Nechama Guard.        Final Clinical Impression(s) / ED Diagnoses Final diagnoses:  None    Rx / DC Orders ED Discharge Orders     None         Sherwood Gambler, MD 02/07/23 1657

## 2023-02-07 NOTE — Progress Notes (Signed)
Lower extremity venous right study completed.  Preliminary results relayed to Nechama Guard, MD.   See CV Proc for preliminary results report.   Darlin Coco, RDMS, RVT

## 2023-02-07 NOTE — Discharge Instructions (Addendum)
There is no evidence of infection or gout based off the fluid obtained from your knee today.  If the fluid does grow out any bacteria, you will be called back to the hospital. Your ultrasound was negative for blood clot.  Please make an appointment with the orthopedist listed in your discharge paperwork to discuss further workup and management of your knee pain.  We have provided you with crutches and knee immobilizer to help with stability of your knee.  You can take Tylenol and ibuprofen as needed for knee pain and continue to rest and ice the knee. Use the crutches as needed.  Come back if any high fevers, new redness and streaking up and down the leg, increased swelling, uncontrolled pain, or any other symptoms concerning to you.

## 2023-02-07 NOTE — ED Provider Notes (Signed)
Clinical Course as of 02/07/23 1804  Fri Feb 07, 2023  1617 89 M with atraumatic right knee pain, not erythematous, is warm, and pain with flexion. Put on indomethacin and prednisone Sunday, still not better. Follow up arthrocentesis meds.  [VB]  [id number] WBC, Synovial: 78 [VB]  1639 WBC, Synovial: 78 [VB]  1708 I reassessed patient he is in no acute distress nontoxic.  His joint fluid from arthrocentesis does not seem consistent with infection or septic joint is white blood cell count is 78 and normal and there are no crystals seen.  When I started talking the patient he tells me he was actually sent here to get ruled out for a blood clot.  There is trace pitting edema throughout the right lower extremity that is not present on left lower extremity.  There is no erythema or streaking concerning for cellulitis.  Will obtain DVT scan. [VB]  1803 DVT scan negative.  Offered patient knee brace he does not want it.  Did provide crutches and orthopedics information for outpatient follow-up.  Advised continue Tylenol and ibuprofen.  Discussed strict return precautions.  All questions answered, patient safe for discharge at this time. [VB]    Clinical Course User Index [VB] Elgie Congo, MD      Elgie Congo, MD 02/07/23 [phone]

## 2023-02-07 NOTE — Progress Notes (Signed)
Orthopedic Tech Progress Note Patient Details:  Kelly Gates [birth year] [id number]  Ortho Devices Type of Ortho Device: Crutches Ortho Device/Splint Interventions: Application, Adjustment, Ordered   Post Interventions Patient Tolerated: Well Instructions Provided: Care of device, Poper ambulation with device, Adjustment of device Patient refused knee immobilizer, but requested to keep crutches. Vernona Rieger 02/07/2023, 5:22 PM

## 2023-02-10 LAB — BODY FLUID CULTURE W GRAM STAIN
Culture: NO GROWTH
Gram Stain: NONE SEEN
